# Patient Record
Sex: Male | Born: 1940
Health system: Southern US, Community
[De-identification: ages and names within clinical notes are randomized; demographics above are authoritative.]

## PROBLEM LIST (undated history)

## (undated) DIAGNOSIS — K635 Polyp of colon: Secondary | ICD-10-CM

## (undated) DIAGNOSIS — N189 Chronic kidney disease, unspecified: Secondary | ICD-10-CM

## (undated) DIAGNOSIS — E785 Hyperlipidemia, unspecified: Secondary | ICD-10-CM

## (undated) DIAGNOSIS — J45909 Unspecified asthma, uncomplicated: Secondary | ICD-10-CM

## (undated) DIAGNOSIS — K219 Gastro-esophageal reflux disease without esophagitis: Secondary | ICD-10-CM

## (undated) DIAGNOSIS — B019 Varicella without complication: Secondary | ICD-10-CM

## (undated) DIAGNOSIS — A539 Syphilis, unspecified: Secondary | ICD-10-CM

## (undated) DIAGNOSIS — M1990 Unspecified osteoarthritis, unspecified site: Secondary | ICD-10-CM

## (undated) DIAGNOSIS — C61 Malignant neoplasm of prostate: Secondary | ICD-10-CM

## (undated) DIAGNOSIS — R011 Cardiac murmur, unspecified: Secondary | ICD-10-CM

## (undated) DIAGNOSIS — I10 Essential (primary) hypertension: Secondary | ICD-10-CM

## (undated) DIAGNOSIS — T7840XA Allergy, unspecified, initial encounter: Secondary | ICD-10-CM

## (undated) DIAGNOSIS — E119 Type 2 diabetes mellitus without complications: Secondary | ICD-10-CM

## (undated) HISTORY — DX: Cardiac murmur, unspecified: R01.1

## (undated) HISTORY — DX: Syphilis, unspecified: A53.9

## (undated) HISTORY — PX: INGUINAL LYMPH NODE BIOPSY: SHX5865

## (undated) HISTORY — DX: Varicella without complication: B01.9

## (undated) HISTORY — DX: Unspecified asthma, uncomplicated: J45.909

## (undated) HISTORY — DX: Unspecified osteoarthritis, unspecified site: M19.90

## (undated) HISTORY — DX: Allergy, unspecified, initial encounter: T78.40XA

## (undated) HISTORY — DX: Hyperlipidemia, unspecified: E78.5

## (undated) HISTORY — DX: Gastro-esophageal reflux disease without esophagitis: K21.9

## (undated) HISTORY — DX: Essential (primary) hypertension: I10

## (undated) HISTORY — DX: Polyp of colon: K63.5

## (undated) HISTORY — DX: Chronic kidney disease, unspecified: N18.9

## (undated) HISTORY — PX: PROSTATE BIOPSY: SHX241

---

## 2014-07-09 DIAGNOSIS — Z79899 Other long term (current) drug therapy: Secondary | ICD-10-CM | POA: Diagnosis not present

## 2014-07-09 DIAGNOSIS — I10 Essential (primary) hypertension: Secondary | ICD-10-CM | POA: Diagnosis not present

## 2017-03-18 DIAGNOSIS — Z23 Encounter for immunization: Secondary | ICD-10-CM | POA: Diagnosis not present

## 2017-03-18 DIAGNOSIS — Z0000 Encounter for general adult medical examination without abnormal findings: Secondary | ICD-10-CM | POA: Diagnosis not present

## 2017-03-18 DIAGNOSIS — Z125 Encounter for screening for malignant neoplasm of prostate: Secondary | ICD-10-CM | POA: Diagnosis not present

## 2017-03-18 DIAGNOSIS — I129 Hypertensive chronic kidney disease with stage 1 through stage 4 chronic kidney disease, or unspecified chronic kidney disease: Secondary | ICD-10-CM | POA: Diagnosis not present

## 2017-03-18 DIAGNOSIS — Z1211 Encounter for screening for malignant neoplasm of colon: Secondary | ICD-10-CM | POA: Diagnosis not present

## 2017-03-18 DIAGNOSIS — E782 Mixed hyperlipidemia: Secondary | ICD-10-CM | POA: Diagnosis not present

## 2017-04-06 DIAGNOSIS — I10 Essential (primary) hypertension: Secondary | ICD-10-CM | POA: Diagnosis not present

## 2017-04-06 DIAGNOSIS — Z86010 Personal history of colonic polyps: Secondary | ICD-10-CM | POA: Diagnosis not present

## 2017-04-06 DIAGNOSIS — K219 Gastro-esophageal reflux disease without esophagitis: Secondary | ICD-10-CM | POA: Diagnosis not present

## 2017-04-14 DIAGNOSIS — K5730 Diverticulosis of large intestine without perforation or abscess without bleeding: Secondary | ICD-10-CM | POA: Diagnosis not present

## 2017-04-14 DIAGNOSIS — D122 Benign neoplasm of ascending colon: Secondary | ICD-10-CM | POA: Diagnosis not present

## 2017-04-14 DIAGNOSIS — K635 Polyp of colon: Secondary | ICD-10-CM | POA: Diagnosis not present

## 2017-04-14 DIAGNOSIS — D123 Benign neoplasm of transverse colon: Secondary | ICD-10-CM | POA: Diagnosis not present

## 2017-04-14 DIAGNOSIS — Z86010 Personal history of colonic polyps: Secondary | ICD-10-CM | POA: Diagnosis not present

## 2017-05-18 DIAGNOSIS — H40023 Open angle with borderline findings, high risk, bilateral: Secondary | ICD-10-CM | POA: Diagnosis not present

## 2017-08-26 DIAGNOSIS — H00014 Hordeolum externum left upper eyelid: Secondary | ICD-10-CM | POA: Diagnosis not present

## 2017-09-15 DIAGNOSIS — I129 Hypertensive chronic kidney disease with stage 1 through stage 4 chronic kidney disease, or unspecified chronic kidney disease: Secondary | ICD-10-CM | POA: Diagnosis not present

## 2017-09-15 DIAGNOSIS — N182 Chronic kidney disease, stage 2 (mild): Secondary | ICD-10-CM | POA: Diagnosis not present

## 2017-09-15 DIAGNOSIS — E782 Mixed hyperlipidemia: Secondary | ICD-10-CM | POA: Diagnosis not present

## 2017-09-15 DIAGNOSIS — R972 Elevated prostate specific antigen [PSA]: Secondary | ICD-10-CM | POA: Diagnosis not present

## 2017-09-15 LAB — PSA: PSA: 28.9

## 2017-09-15 LAB — HEMOGLOBIN A1C: Hemoglobin A1C: 5.8

## 2017-10-14 DIAGNOSIS — R972 Elevated prostate specific antigen [PSA]: Secondary | ICD-10-CM | POA: Diagnosis not present

## 2017-10-14 DIAGNOSIS — N402 Nodular prostate without lower urinary tract symptoms: Secondary | ICD-10-CM | POA: Diagnosis not present

## 2017-10-28 DIAGNOSIS — R972 Elevated prostate specific antigen [PSA]: Secondary | ICD-10-CM | POA: Diagnosis not present

## 2017-11-08 ENCOUNTER — Other Ambulatory Visit: Payer: Self-pay | Admitting: Urology

## 2017-11-08 DIAGNOSIS — C61 Malignant neoplasm of prostate: Secondary | ICD-10-CM

## 2017-12-05 ENCOUNTER — Encounter (HOSPITAL_COMMUNITY)
Admission: RE | Admit: 2017-12-05 | Discharge: 2017-12-05 | Disposition: A | Discharge status: Home / Self Care | Payer: Medicare Other | Source: Ambulatory Visit | Attending: Urology | Admitting: Urology

## 2017-12-05 DIAGNOSIS — C61 Malignant neoplasm of prostate: Secondary | ICD-10-CM | POA: Diagnosis not present

## 2017-12-05 MED ORDER — TECHNETIUM TC 99M MEDRONATE IV KIT
20.0000 | PACK | Freq: Once | INTRAVENOUS | Status: AC | PRN
  Administered 2017-12-05: 21 via INTRAVENOUS

## 2017-12-27 ENCOUNTER — Other Ambulatory Visit: Payer: Self-pay | Admitting: Urology

## 2017-12-27 DIAGNOSIS — C61 Malignant neoplasm of prostate: Secondary | ICD-10-CM

## 2018-01-04 ENCOUNTER — Other Ambulatory Visit: Payer: Self-pay | Admitting: Radiology

## 2018-01-05 ENCOUNTER — Other Ambulatory Visit: Payer: Self-pay | Admitting: Urology

## 2018-01-05 ENCOUNTER — Encounter (HOSPITAL_COMMUNITY): Payer: Self-pay

## 2018-01-05 ENCOUNTER — Ambulatory Visit (HOSPITAL_COMMUNITY)
Admission: RE | Admit: 2018-01-05 | Discharge: 2018-01-05 | Disposition: A | Discharge status: Home / Self Care | Payer: Medicare Other | Source: Ambulatory Visit | Attending: Radiology | Admitting: Radiology

## 2018-01-05 ENCOUNTER — Ambulatory Visit (HOSPITAL_COMMUNITY)
Admission: RE | Admit: 2018-01-05 | Discharge: 2018-01-05 | Disposition: A | Discharge status: Home / Self Care | Payer: Medicare Other | Source: Ambulatory Visit | Attending: Urology | Admitting: Urology

## 2018-01-05 ENCOUNTER — Other Ambulatory Visit (HOSPITAL_COMMUNITY): Payer: Self-pay | Admitting: Student-PharmD

## 2018-01-05 DIAGNOSIS — R590 Localized enlarged lymph nodes: Secondary | ICD-10-CM | POA: Diagnosis present

## 2018-01-05 DIAGNOSIS — C774 Secondary and unspecified malignant neoplasm of inguinal and lower limb lymph nodes: Secondary | ICD-10-CM | POA: Diagnosis not present

## 2018-01-05 DIAGNOSIS — R61 Generalized hyperhidrosis: Secondary | ICD-10-CM | POA: Diagnosis present

## 2018-01-05 DIAGNOSIS — C61 Malignant neoplasm of prostate: Secondary | ICD-10-CM

## 2018-01-05 LAB — BASIC METABOLIC PANEL
Anion gap: 9 (ref 5–15)
BUN: 13 mg/dL (ref 8–23)
CHLORIDE: 107 mmol/L (ref 98–111)
CO2: 26 mmol/L (ref 22–32)
CREATININE: 0.96 mg/dL (ref 0.61–1.24)
Calcium: 9.6 mg/dL (ref 8.9–10.3)
GFR calc non Af Amer: >60 mL/min (ref >60)
Glucose, Bld: 108 mg/dL — ABNORMAL HIGH (ref 70–99)
POTASSIUM: 4 mmol/L (ref 3.5–5.1)
Sodium: 142 mmol/L (ref 135–145)

## 2018-01-05 LAB — CBC WITH DIFFERENTIAL/PLATELET
Basophils Absolute: 0 10*3/uL (ref 0.0–0.1)
Basophils Relative: 1 %
EOS ABS: 0.1 10*3/uL (ref 0.0–0.7)
Eosinophils Relative: 3 %
HEMATOCRIT: 44.7 % (ref 39.0–52.0)
HEMOGLOBIN: 15.6 g/dL (ref 13.0–17.0)
LYMPHS ABS: 1.6 10*3/uL (ref 0.7–4.0)
LYMPHS PCT: 32 %
MCH: 33.5 pg (ref 26.0–34.0)
MCHC: 34.9 g/dL (ref 30.0–36.0)
MCV: 95.9 fL (ref 78.0–100.0)
MONOS PCT: 8 %
Monocytes Absolute: 0.4 10*3/uL (ref 0.1–1.0)
NEUTROS ABS: 3 10*3/uL (ref 1.7–7.7)
NEUTROS PCT: 58 %
Platelets: 212 10*3/uL (ref 150–400)
RBC: 4.66 MIL/uL (ref 4.22–5.81)
RDW: 13.5 % (ref 11.5–15.5)
WBC: 5.2 10*3/uL (ref 4.0–10.5)

## 2018-01-05 LAB — PROTIME-INR
INR: 0.91
Prothrombin Time: 12.2 seconds (ref 11.4–15.2)

## 2018-01-05 MED ORDER — SODIUM CHLORIDE 0.9 % IJ SOLN
INTRAMUSCULAR | Status: AC
  Filled 2018-01-05: qty 30

## 2018-01-05 MED ORDER — MIDAZOLAM HCL 2 MG/2ML IJ SOLN
INTRAMUSCULAR | Status: AC
  Filled 2018-01-05: qty 2

## 2018-01-05 MED ORDER — LIDOCAINE HCL (PF) 1 % IJ SOLN
INTRAMUSCULAR | Status: AC | PRN
  Administered 2018-01-05: 10 mL

## 2018-01-05 MED ORDER — FENTANYL CITRATE (PF) 100 MCG/2ML IJ SOLN
INTRAMUSCULAR | Status: AC
  Filled 2018-01-05: qty 2

## 2018-01-05 MED ORDER — MIDAZOLAM HCL 2 MG/2ML IJ SOLN
INTRAMUSCULAR | Status: AC | PRN
  Administered 2018-01-05: 1 mg via INTRAVENOUS

## 2018-01-05 MED ORDER — SODIUM CHLORIDE 0.9 % IV SOLN
INTRAVENOUS | Status: DC
  Administered 2018-01-05: 11:00:00 via INTRAVENOUS

## 2018-01-05 MED ORDER — LIDOCAINE HCL 1 % IJ SOLN
INTRAMUSCULAR | Status: AC
  Filled 2018-01-05: qty 20

## 2018-01-05 MED ORDER — FENTANYL CITRATE (PF) 100 MCG/2ML IJ SOLN
INTRAMUSCULAR | Status: AC | PRN
  Administered 2018-01-05: 50 ug via INTRAVENOUS

## 2018-01-05 NOTE — H&P (Signed)
Chief Complaint: Patient was seen in consultation today for image guided right inguinal lymph node biopsy  Referring Physician(s): [place]  Supervising Physician: Aletta Edouard  Patient Status: [facility] - Out-pt  History of Present Illness: Lawrence Wong is a 77 y.o. male with history of recently diagnosed prostate cancer and CT scan last month showing borderline enlarged pelvic/iliac lymph nodes.  He presents today for image guided right inguinal lymph node biopsy for further evaluation.  History reviewed. No pertinent past medical history.  History reviewed. No pertinent surgical history.  Allergies: Patient has no allergy information on record.  Medications: Prior to Admission medications   Not on File     History reviewed. No pertinent family history.  Social History   Socioeconomic History  . Marital status: Single    Spouse name: Not on file  . Number of children: Not on file  . Years of education: Not on file  . Highest education level: Not on file  Occupational History  . Not on file  Social Needs  . Financial resource strain: Not on file  . Food insecurity:    Worry: Not on file    Inability: Not on file  . Transportation needs:    Medical: Not on file    Non-medical: Not on file  Tobacco Use  . Smoking status: Former Research scientist (life sciences)  . Smokeless tobacco: Never Used  Substance and Sexual Activity  . Alcohol use: Not on file  . Drug use: Never  . Sexual activity: Not on file  Lifestyle  . Physical activity:    Days per week: Not on file    Minutes per session: Not on file  . Stress: Not on file  Relationships  . Social connections:    Talks on phone: Not on file    Gets together: Not on file    Attends religious service: Not on file    Active member of club or organization: Not on file    Attends meetings of clubs or organizations: Not on file    Relationship status: Not on file  Other Topics Concern  . Not on file  Social History  Narrative  . Not on file      Review of Systems denies fever, headache, chest pain, dyspnea, cough, abdominal/back pain, nausea, vomiting or bleeding.  Vital Signs: BP (!) 148/97 (BP Location: Right Arm)   Pulse (!) 57   Temp 97.6 F (36.4 C) (Oral)   Resp 16   SpO2 98%   Physical Exam awake, alert.  Chest few fine bibasilar crackles.  Heart with regular rate and rhythm.  Abdomen soft, positive bowel sounds, nontender.  No lower extremity edema.  Imaging: No results found.  Labs:  CBC: Recent Labs    01/05/18 1116  WBC 5.2  HGB 15.6  HCT 44.7  PLT 212    COAGS: Recent Labs    01/05/18 1116  INR 0.91    BMP: Recent Labs    01/05/18 1116  NA 142  K 4.0  CL 107  CO2 26  GLUCOSE 108*  BUN 13  CALCIUM 9.6  CREATININE 0.96  GFRNONAA >60  GFRAA >60    LIVER FUNCTION TESTS: No results for input(s): BILITOT, AST, ALT, ALKPHOS, PROT, ALBUMIN in the last 8760 hours.  TUMOR MARKERS: No results for input(s): AFPTM, CEA, CA199, CHROMGRNA in the last 8760 hours.  Assessment and Plan:  77 y.o. male with history of recently diagnosed prostate cancer and CT scan last month showing borderline enlarged pelvic  lymph nodes.  He presents today for image guided right inguinal lymph node biopsy for further evaluation.Risks and benefits discussed with the patient including, but not limited to bleeding, infection, damage to adjacent structures or low yield requiring additional tests.  All of the patient's questions were answered, patient is agreeable to proceed. Consent signed and in chart.     Thank you for this interesting consult.  I greatly enjoyed meeting Lawrence Wong and look forward to participating in their care.  A copy of this report was sent to the requesting provider on this date.  Electronically Signed: D. Rowe Robert, PA-C 01/05/2018, 12:09 PM   I spent a total of 20 minutes    in face to face in clinical consultation, greater than 50% of which was  counseling/coordinating care for image guided right inguinal lymph node biopsy

## 2018-01-05 NOTE — Discharge Instructions (Signed)
Moderate Conscious Sedation, Adult, Care After °These instructions provide you with information about caring for yourself after your procedure. Your health care provider may also give you more specific instructions. Your treatment has been planned according to current medical practices, but problems sometimes occur. Call your health care provider if you have any problems or questions after your procedure. °What can I expect after the procedure? °After your procedure, it is common: °· To feel sleepy for several hours. °· To feel clumsy and have poor balance for several hours. °· To have poor judgment for several hours. °· To vomit if you eat too soon. ° °Follow these instructions at home: °For at least 24 hours after the procedure: ° °· Do not: °? Participate in activities where you could fall or become injured. °? Drive. °? Use heavy machinery. °? Drink alcohol. °? Take sleeping pills or medicines that cause drowsiness. °? Make important decisions or sign legal documents. °? Take care of children on your own. °· Rest. °Eating and drinking °· Follow the diet recommended by your health care provider. °· If you vomit: °? Drink water, juice, or soup when you can drink without vomiting. °? Make sure you have little or no nausea before eating solid foods. °General instructions °· Have a responsible adult stay with you until you are awake and alert. °· Take over-the-counter and prescription medicines only as told by your health care provider. °· If you smoke, do not smoke without supervision. °· Keep all follow-up visits as told by your health care provider. This is important. °Contact a health care provider if: °· You keep feeling nauseous or you keep vomiting. °· You feel light-headed. °· You develop a rash. °· You have a fever. °Get help right away if: °· You have trouble breathing. °This information is not intended to replace advice given to you by your health care provider. Make sure you discuss any questions you have  with your health care provider. °Document Released: 03/07/2013 Document Revised: 10/20/2015 Document Reviewed: 09/06/2015 °Elsevier Interactive Patient Education © 2018 Elsevier Inc. ° ° °Needle Biopsy, Care After °These instructions give you information about caring for yourself after your procedure. Your doctor may also give you more specific instructions. Call your doctor if you have any problems or questions after your procedure. °Follow these instructions at home: °· Rest as told by your doctor. °· Take medicines only as told by your doctor. °· There are many different ways to close and cover the biopsy site, including stitches (sutures), skin glue, and adhesive strips. Follow instructions from your doctor about: °? How to take care of your biopsy site. °? When and how you should change your bandage (dressing).  You may remove your dressing tomorrow.  You may shower tomorrow. °? When you should remove your dressing. °? Removing whatever was used to close your biopsy site. °· Check your biopsy site every day for signs of infection. Watch for: °? Redness, swelling, or pain. °? Fluid, blood, or pus. °Contact a doctor if: °· You have a fever. °· You have redness, swelling, or pain at the biopsy site, and it lasts longer than a few days. °· You have fluid, blood, or pus coming from the biopsy site. °· You feel sick to your stomach (nauseous). °· You throw up (vomit). °Get help right away if: °· You are short of breath. °· You have trouble breathing. °· Your chest hurts. °· You feel dizzy or you pass out (faint). °· You have bleeding that does   not stop with pressure or a bandage. °· You cough up blood. °· Your belly (abdomen) hurts. °This information is not intended to replace advice given to you by your health care provider. Make sure you discuss any questions you have with your health care provider. °Document Released: 04/29/2008 Document Revised: 10/23/2015 Document Reviewed: 05/13/2014 °Elsevier Interactive  Patient Education © 2018 Elsevier Inc. ° °

## 2018-01-05 NOTE — Sedation Documentation (Signed)
No more sedation to be given waiting for repeat CT

## 2018-01-05 NOTE — Sedation Documentation (Signed)
MD not able to see lymph node waiting for CT to rescan.

## 2018-01-05 NOTE — Procedures (Signed)
Interventional Radiology Procedure Note  Procedure: CT guided biopsy of right inguinal lymph node  Complications: None  Estimated Blood Loss: None  Findings: 10 x 14 mm right high inguinal lymph node sampled with 18 G core device x 3 via 17 G needle.   Venetia Night. Kathlene Cote, M.D Pager:  [phone]

## 2018-01-12 ENCOUNTER — Encounter: Payer: Self-pay | Admitting: Radiation Oncology

## 2018-01-24 ENCOUNTER — Telehealth: Payer: Self-pay | Admitting: Radiation Oncology

## 2018-01-24 NOTE — Telephone Encounter (Signed)
01/24/2018 @ 10:55 am voice message received from the upstairs operator that patient want to cancel appt for 01/25/18 with Dr. Tammi Klippel. I have called and left a message to see if I can get the patient to [facility].

## 2018-01-25 ENCOUNTER — Ambulatory Visit: Payer: Medicare Other | Admitting: Radiation Oncology

## 2018-02-08 ENCOUNTER — Encounter: Payer: Self-pay | Admitting: Radiation Oncology

## 2018-02-08 NOTE — Progress Notes (Signed)
GU Location of Tumor / Histology: prostatic adenocarcinoma  If Prostate Cancer, Gleason Score is (4 + 3) and PSA is (32.7). Prostate volume: 57 ml  Lawrence Wong had borderline enlarged Left and Right external iliac lymph nodes. Bone scan/CT A/P on 12/05/2017 with no definite evidence of osseous metastatic disease but 1 irregular enhancing contour in the posterior left aspect of the prostate gland concerning for extracapsular extension. Biopsy of right inguinal lymph node negative for disease.     Past/Anticipated interventions by urology, if any: prostate biopsy, referral for inguinal biopsy, CT abdomen/pelvis, bone scan, referral to radiation oncology  Past/Anticipated interventions by medical oncology, if any: no  Weight changes, if any: no  Bowel/Bladder complaints, if any: IPSS 8. SHIM 12. Denies dysuria, hematuria, urinary leakage or incontinence.    Nausea/Vomiting, if any: no  Pain issues, if any:  Reports back pain x 30-40 years. Denies any new pain.   SAFETY ISSUES:  Prior radiation? no  Pacemaker/ICD? no  Possible current pregnancy? no  Is the patient on methotrexate? no  Current Complaints / other details:  77 year old male. NKDA. Single. 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

## 2018-02-09 ENCOUNTER — Ambulatory Visit
Admission: RE | Admit: 2018-02-09 | Discharge: 2018-02-09 | Disposition: A | Discharge status: Home / Self Care | Payer: Medicare Other | Source: Ambulatory Visit | Attending: Radiation Oncology | Admitting: Radiation Oncology

## 2018-02-09 ENCOUNTER — Other Ambulatory Visit: Payer: Self-pay

## 2018-02-09 ENCOUNTER — Encounter: Payer: Self-pay | Admitting: Medical Oncology

## 2018-02-09 ENCOUNTER — Encounter: Payer: Self-pay | Admitting: Radiation Oncology

## 2018-02-09 DIAGNOSIS — Z87891 Personal history of nicotine dependence: Secondary | ICD-10-CM | POA: Diagnosis not present

## 2018-02-09 DIAGNOSIS — C61 Malignant neoplasm of prostate: Secondary | ICD-10-CM | POA: Diagnosis not present

## 2018-02-09 HISTORY — DX: Malignant neoplasm of prostate: C61

## 2018-02-09 NOTE — Progress Notes (Signed)
Introduced myself to Lawrence Wong as the prostate nurse navigator and my role. He interested in LT- ADT and radiation. He is aware that Dr. Alan Ripper office will call him with an appointment for ADT then we will schedule CT simulation  8 weeks later. I will continue to follow and asked him to call with questions or concerns.

## 2018-02-09 NOTE — Progress Notes (Signed)
Radiation Oncology         [phone] ________________________________  Initial Outpatient Consultation  Name: Lawrence Wong MRN: [id number]  Date: 02/09/2018  DOB: [birth year]  RX:[id number], No Pcp Per  Franchot Gallo, MD   REFERRING PHYSICIAN: Franchot Gallo, MD  DIAGNOSIS: 77 y.o. gentleman with Stage T2a adenocarcinoma of the prostate with Gleason Score of 4+3, and PSA of 32.7    ICD-10-CM   1. Malignant neoplasm of prostate ([place]) C61     HISTORY OF PRESENT ILLNESS: Lawrence Wong is a 77 y.o. male with a diagnosis of prostate cancer. He was noted to have an elevated PSA of 28.9 in April 2019 by his primary care physician, Dr. Glendale Chard.  Per patient, he had had normal PSAs up to that point and had been seen at [facility] several years prior for a prostate exam which was normal.  Due to recent elevated PSA, he was referred back for evaluation in urology by Dr. Diona Fanti on 10/14/2017, where a digital rectal examination was performed revealing a 6 mm prostate nodule in the left base.  Repeat PSA at that time was increased to 32.70.  Therefore, the patient proceeded to transrectal ultrasound with 12 biopsies of the prostate on 10/28/2017.  The prostate volume measured 57 cc.  Out of 12 core biopsies, 4 were positive.  The maximum Gleason score was 4+3, and this was seen in the left mid lateral.  There was Gleason 3+4 seen in the left base lateral and left mid, and Gleason 3+3 seen in the left apex lateral.  Biopsies of prostate reveal:   Staging scans on 12/05/2017 include CT of the abdomen/pelvis which showed irregular enhancing contour in the posterior left aspect of the prostate gland concerning for prostate carcinoma extracapsular extension. There were also indeterminate borderline enlarged left and right external iliac lymph nodes. No periaortic lymphadenopathy. A bone scan on 12/05/17 showed scattered degenerative type uptake at the shoulders, hips, knees, elbows, and  wrists but no definite evidence of osseous metastatic disease.  The patient underwent a CT guided core needle biopsy of a high right inguinal lymph node on 01/05/2018 which revealed lymph nodal tissue with no tumor identified.  The patient reviewed these results with his urologist and he has kindly been referred today for discussion of potential radiation treatment options.  PREVIOUS RADIATION THERAPY: No  PAST MEDICAL HISTORY:  Past Medical History:  Diagnosis Date  . Prostate cancer ([place])       PAST SURGICAL HISTORY: Past Surgical History:  Procedure Laterality Date  . INGUINAL LYMPH NODE BIOPSY    . PROSTATE BIOPSY      FAMILY HISTORY:  Family History  Problem Relation Age of Onset  . Cancer Neg Hx     SOCIAL HISTORY:  Social History   Socioeconomic History  . Marital status: Single    Spouse name: Not on file  . Number of children: 1  . Years of education: Not on file  . Highest education level: Not on file  Occupational History  . Occupation: retired  Scientific laboratory technician  . Financial resource strain: Not on file  . Food insecurity:    Worry: Not on file    Inability: Not on file  . Transportation needs:    Medical: Not on file    Non-medical: Not on file  Tobacco Use  . Smoking status: Former Smoker    Packs/day: 1.00    Years: 40.00    Pack years: 40.00    Types: Cigarettes  Last attempt to quit: 05/31/1998    Years since quitting: 19.7  . Smokeless tobacco: Never Used  . Tobacco comment: States, "I have been around cigarettes my entire lift. 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 Alcohol use: Not Currently  . Drug use: Never  . Sexual activity: Yes  Lifestyle  . Physical activity:    Days per week: Not on file    Minutes per session: Not on file  . Stress: Not on file  Relationships  . Social connections:    Talks on phone: Not on file    Gets together: Not on file    Attends religious service: Not on  file    Active member of club or organization: Not on file    Attends meetings of clubs or organizations: Not on file    Relationship status: Not on file  . Intimate partner violence:    Fear of current or ex partner: Not on file    Emotionally abused: Not on file    Physically abused: Not on file    Forced sexual activity: Not on file  Other Topics Concern  . Not on file  Social History Narrative   Single. Has one son who resides in [place]. 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 Resides primarily in [place], but commutes back and forth between [place] and his property in [place].    ALLERGIES: Patient has no known allergies.  MEDICATIONS:  No current outpatient medications on file.   No current facility-administered medications for this encounter.     REVIEW OF SYSTEMS:  On review of systems, the patient reports that he is doing well overall. He denies any chest pain, shortness of breath, cough, fevers, chills, night sweats, or unintended weight changes. He denies any bowel disturbances, and denies abdominal pain, nausea or vomiting. He reports chronic back pain x30-40 years, but denies any new musculoskeletal or joint aches or pains. His IPSS was 8, indicating moderate urinary symptoms. He denies dysuria, hematuria, urinary leakage or incontinence. His SHIM score is 12, indicating he is able to complete sexual activity with some attempts. A complete review of systems is obtained and is otherwise negative.    PHYSICAL EXAM:  Wt Readings from Last 3 Encounters:  02/09/18 164 lb 3.2 oz (74.5 kg)   Temp Readings from Last 3 Encounters:  02/09/18 98.2 F (36.8 C) (Oral)  01/05/18 (!) 97.5 F (36.4 C) (Oral)  01/05/18 97.9 F (36.6 C) (Oral)   BP Readings from Last 3 Encounters:  02/09/18 (!) 144/86  01/05/18 122/79  01/05/18 (!) 142/96   Pulse Readings from Last 3 Encounters:  02/09/18 68  01/05/18 (!) 55   01/05/18 (!) 48   Pain Assessment Pain Score: 0-No pain/10  In general this is a well appearing African-American male in no acute distress. He is alert and oriented x4 and appropriate throughout the examination. HEENT reveals that the patient is normocephalic, atraumatic. EOMs are intact. PERRLA. Skin is intact without any evidence of gross lesions. Cardiovascular exam reveals a regular rate and rhythm, no clicks rubs or murmurs are auscultated. Chest is clear to auscultation bilaterally. Lymphatic assessment is performed and does not reveal any adenopathy in the cervical, supraclavicular, axillary, or inguinal chains. Abdomen has active bowel sounds in all quadrants and is intact. The abdomen is soft, non tender, non distended. Lower extremities are negative for pretibial pitting  edema, deep calf tenderness, cyanosis or clubbing.   KPS = 100  100 - Normal; no complaints; no evidence of disease. 90   - Able to carry on normal activity; minor signs or symptoms of disease. 80   - Normal activity with effort; some signs or symptoms of disease. 9   - Cares for self; unable to carry on normal activity or to do active work. 60   - Requires occasional assistance, but is able to care for most of his personal needs. 50   - Requires considerable assistance and frequent medical care. 46   - Disabled; requires special care and assistance. 31   - Severely disabled; hospital admission is indicated although death not imminent. 51   - Very sick; hospital admission necessary; active supportive treatment necessary. 10   - Moribund; fatal processes progressing rapidly. 0     - Dead  Karnofsky DA, Abelmann Gambier, Craver LS and Burchenal [facility] [phone]) The use of the nitrogen mustards in the palliative treatment of carcinoma: with particular reference to bronchogenic carcinoma Cancer 1 634-56  LABORATORY DATA:  Lab Results  Component Value Date   WBC 5.2 01/05/2018   HGB 15.6 01/05/2018   HCT 44.7 01/05/2018    MCV 95.9 01/05/2018   PLT 212 01/05/2018   Lab Results  Component Value Date   NA 142 01/05/2018   K 4.0 01/05/2018   CL 107 01/05/2018   CO2 26 01/05/2018   No results found for: ALT, AST, GGT, ALKPHOS, BILITOT   RADIOGRAPHY: No results found.    IMPRESSION/PLAN: 1. 77 y.o. gentleman with Stage cT2a adenocarcinoma of the prostate with Gleason Score of 4+3, and PSA of 32.7. We discussed the patient's workup and outlined the nature of prostate cancer in this setting. The patient's T stage, Gleason's score, and PSA put him into the high risk group. Accordingly, he is eligible for 8 weeks of external radiation in combination with androgen deprivation therapy. We discussed the available radiation techniques, and focused on the details and logistics and delivery. We discussed and outlined the risks, benefits, short and long-term effects associated with radiotherapy and compared and contrasted these with prostatectomy. We discussed the role of SpaceOAR in reducing the rectal toxicity associated with radiotherapy. We also detailed the role of ADT in the treatment of high-risk prostate cancer and outlined the associated side effects that could be expected with this therapy.  At the end of the conversation the patient is interested in moving forward with 8 weeks of external radiotherapy in combination with LT-ADT. He has not received his first Lupron injection, and has not had placement of gold fiducial markers. We will share our discussion with Dr. Diona Fanti and move forward with treatment planning accordingly.  We will contact [facility] to make arrangements to start ADT in the near future and would anticipate beginning radiotherapy approximately 8 weeks thereafter.  He will need an appointment for fiducial markers and SpaceOAR gel placement in early November, prior to [place], in preparation for proceeding with IMRT around mid-November 2019.    Nicholos Johns, PA-C    Tyler Pita, MD  [facility] Direct Dial: [phone]  Fax: [phone] [email or web address]  Skype  [email or web address]  This document serves as a record of services personally performed by Tyler Pita, MD and Freeman Caldron, PA-C. It was created on their behalf by Rae Lips, a trained medical scribe. The creation of this record is based on the scribe's personal observations and the providers'  statements to them. This document has been checked and approved by the attending providers.

## 2018-02-09 NOTE — Progress Notes (Signed)
See progress note under physician encounter. 

## 2018-02-27 ENCOUNTER — Telehealth: Payer: Self-pay | Admitting: Nurse Practitioner

## 2018-02-27 NOTE — Telephone Encounter (Signed)
I called the patient to schedule his AWV w/ [place].  He said that he received a letter from [facility] that we were no longer in network.  I explained that it was sent in error.  He requested to cancel the 03/24/18 appt because he is supposed to be starting radiation treatments around the end of October.  I tried to reschedule it, but he said he will call back when he's ready to see PCP. VDM (DD)

## 2018-03-24 ENCOUNTER — Ambulatory Visit: Payer: Self-pay | Admitting: Nurse Practitioner

## 2018-04-06 ENCOUNTER — Telehealth: Payer: Self-pay | Admitting: *Deleted

## 2018-04-06 NOTE — Telephone Encounter (Signed)
CALLED PATIENT TO INFORM OF GOLD SEED PLACEMENT AND SPACE OAR TO BE DONE @ [facility] ON NOV. 21 AND HIS SIM ON 04-26-18 @ DR. MANNING'S OFFICE, LVM  FOR A RETURN CALL

## 2018-04-20 ENCOUNTER — Telehealth: Payer: Self-pay | Admitting: *Deleted

## 2018-04-20 NOTE — Telephone Encounter (Signed)
RETURNED PATIENT'S PHONE CALL, LVM FOR A RETURN CALL 

## 2018-04-25 ENCOUNTER — Other Ambulatory Visit: Payer: Self-pay | Admitting: Urology

## 2018-04-25 DIAGNOSIS — C61 Malignant neoplasm of prostate: Secondary | ICD-10-CM

## 2018-04-25 NOTE — Progress Notes (Signed)
  Radiation Oncology         [phone] ________________________________  Name: Lawrence Wong MRN: [id number]  Date: 04/26/2018  DOB: 1940/10/03  SIMULATION AND TREATMENT PLANNING NOTE    ICD-10-CM   1. Malignant neoplasm of prostate ([place]) C61     DIAGNOSIS:  77 y.o. gentleman with Stage T2a adenocarcinoma of the prostate with Gleason Score of 4+3, and PSA of 32.7  NARRATIVE:  The patient was brought to the [facility].  Identity was confirmed.  All relevant records and images related to the planned course of therapy were reviewed.  The patient freely provided informed written consent to proceed with treatment after reviewing the details related to the planned course of therapy. The consent form was witnessed and verified by the simulation staff.  Then, the patient was set-up in a stable reproducible supine position for radiation therapy.  A vacuum lock pillow device was custom fabricated to position his legs in a reproducible immobilized position.  Then, I performed a urethrogram under sterile conditions to identify the prostatic apex.  CT images were obtained.  Surface markings were placed.  The CT images were loaded into the planning software.  Then the prostate target and avoidance structures including the rectum, bladder, bowel and hips were contoured.  Treatment planning then occurred.  The radiation prescription was entered and confirmed.  A total of one complex treatment devices were fabricated. I have requested : Intensity Modulated Radiotherapy (IMRT) is medically necessary for this case for the following reason:  Rectal sparing.Marland Kitchen  PLAN:  The patient will receive 45 Gy in 25 fractions of 1.8 Gy, followed by a boost to the prostate to a total dose of 75 Gy with 15 additional fractions of 2.0 Gy.  ________________________________  Sheral Apley Tammi Klippel, M.D.

## 2018-04-26 ENCOUNTER — Ambulatory Visit
Admission: RE | Admit: 2018-04-26 | Discharge: 2018-04-26 | Disposition: A | Discharge status: Home / Self Care | Payer: Medicare Other | Source: Ambulatory Visit | Attending: Radiation Oncology | Admitting: Radiation Oncology

## 2018-04-26 ENCOUNTER — Encounter: Payer: Self-pay | Admitting: Urology

## 2018-04-26 DIAGNOSIS — C61 Malignant neoplasm of prostate: Secondary | ICD-10-CM

## 2018-04-26 NOTE — Progress Notes (Signed)
I have confirmed that the patient started ADT with Dr. Diona Fanti on 02/23/2018 with [facility] Koller and received a 59-month Lupron injection on 03/29/2018.   Nicholos Johns, MMS, PA-C [facility]: [phone]  Fax: [phone]

## 2018-05-01 ENCOUNTER — Ambulatory Visit (HOSPITAL_COMMUNITY)
Admission: RE | Admit: 2018-05-01 | Discharge: 2018-05-01 | Disposition: A | Discharge status: Home / Self Care | Payer: Medicare Other | Source: Ambulatory Visit | Attending: Urology | Admitting: Urology

## 2018-05-01 DIAGNOSIS — C61 Malignant neoplasm of prostate: Secondary | ICD-10-CM | POA: Insufficient documentation

## 2018-05-08 DIAGNOSIS — C61 Malignant neoplasm of prostate: Secondary | ICD-10-CM | POA: Insufficient documentation

## 2018-05-09 ENCOUNTER — Ambulatory Visit
Admission: RE | Admit: 2018-05-09 | Discharge: 2018-05-09 | Disposition: A | Discharge status: Home / Self Care | Payer: Medicare Other | Source: Ambulatory Visit | Attending: Radiation Oncology | Admitting: Radiation Oncology

## 2018-05-09 DIAGNOSIS — C61 Malignant neoplasm of prostate: Secondary | ICD-10-CM | POA: Diagnosis not present

## 2018-05-10 ENCOUNTER — Ambulatory Visit
Admission: RE | Admit: 2018-05-10 | Discharge: 2018-05-10 | Disposition: A | Discharge status: Home / Self Care | Payer: Medicare Other | Source: Ambulatory Visit | Attending: Radiation Oncology | Admitting: Radiation Oncology

## 2018-05-10 DIAGNOSIS — C61 Malignant neoplasm of prostate: Secondary | ICD-10-CM | POA: Diagnosis not present

## 2018-05-11 ENCOUNTER — Ambulatory Visit
Admission: RE | Admit: 2018-05-11 | Discharge: 2018-05-11 | Disposition: A | Discharge status: Home / Self Care | Payer: Medicare Other | Source: Ambulatory Visit | Attending: Radiation Oncology | Admitting: Radiation Oncology

## 2018-05-11 DIAGNOSIS — C61 Malignant neoplasm of prostate: Secondary | ICD-10-CM | POA: Diagnosis not present

## 2018-05-12 ENCOUNTER — Ambulatory Visit
Admission: RE | Admit: 2018-05-12 | Discharge: 2018-05-12 | Disposition: A | Discharge status: Home / Self Care | Payer: Medicare Other | Source: Ambulatory Visit | Attending: Radiation Oncology | Admitting: Radiation Oncology

## 2018-05-12 DIAGNOSIS — C61 Malignant neoplasm of prostate: Secondary | ICD-10-CM | POA: Diagnosis not present

## 2018-05-15 ENCOUNTER — Ambulatory Visit
Admission: RE | Admit: 2018-05-15 | Discharge: 2018-05-15 | Disposition: A | Discharge status: Home / Self Care | Payer: Medicare Other | Source: Ambulatory Visit | Attending: Radiation Oncology | Admitting: Radiation Oncology

## 2018-05-15 DIAGNOSIS — C61 Malignant neoplasm of prostate: Secondary | ICD-10-CM | POA: Diagnosis not present

## 2018-05-16 ENCOUNTER — Ambulatory Visit
Admission: RE | Admit: 2018-05-16 | Discharge: 2018-05-16 | Disposition: A | Discharge status: Home / Self Care | Payer: Medicare Other | Source: Ambulatory Visit | Attending: Radiation Oncology | Admitting: Radiation Oncology

## 2018-05-16 DIAGNOSIS — C61 Malignant neoplasm of prostate: Secondary | ICD-10-CM | POA: Diagnosis not present

## 2018-05-17 ENCOUNTER — Ambulatory Visit
Admission: RE | Admit: 2018-05-17 | Discharge: 2018-05-17 | Disposition: A | Discharge status: Home / Self Care | Payer: Medicare Other | Source: Ambulatory Visit | Attending: Radiation Oncology | Admitting: Radiation Oncology

## 2018-05-17 DIAGNOSIS — C61 Malignant neoplasm of prostate: Secondary | ICD-10-CM | POA: Diagnosis not present

## 2018-05-18 ENCOUNTER — Ambulatory Visit
Admission: RE | Admit: 2018-05-18 | Discharge: 2018-05-18 | Disposition: A | Discharge status: Home / Self Care | Payer: Medicare Other | Source: Ambulatory Visit | Attending: Radiation Oncology | Admitting: Radiation Oncology

## 2018-05-18 DIAGNOSIS — C61 Malignant neoplasm of prostate: Secondary | ICD-10-CM | POA: Diagnosis not present

## 2018-05-18 NOTE — Progress Notes (Signed)
Pt here for patient teaching.  Pt given Radiation and You booklet.  Reviewed areas of pertinence such as diarrhea, fatigue and urinary and bladder changes . Pt able to give teach back of have Imodium on hand and drink plenty of water,. Pt verbalized understanding, and  of information given and will contact nursing with any questions or concerns.     [email or web address]

## 2018-05-19 ENCOUNTER — Ambulatory Visit
Admission: RE | Admit: 2018-05-19 | Discharge: 2018-05-19 | Disposition: A | Discharge status: Home / Self Care | Payer: Medicare Other | Source: Ambulatory Visit | Attending: Radiation Oncology | Admitting: Radiation Oncology

## 2018-05-19 DIAGNOSIS — C61 Malignant neoplasm of prostate: Secondary | ICD-10-CM | POA: Diagnosis not present

## 2018-05-22 ENCOUNTER — Ambulatory Visit
Admission: RE | Admit: 2018-05-22 | Discharge: 2018-05-22 | Disposition: A | Discharge status: Home / Self Care | Payer: Medicare Other | Source: Ambulatory Visit | Attending: Radiation Oncology | Admitting: Radiation Oncology

## 2018-05-22 DIAGNOSIS — C61 Malignant neoplasm of prostate: Secondary | ICD-10-CM | POA: Diagnosis not present

## 2018-05-23 ENCOUNTER — Ambulatory Visit
Admission: RE | Admit: 2018-05-23 | Discharge: 2018-05-23 | Disposition: A | Discharge status: Home / Self Care | Payer: Medicare Other | Source: Ambulatory Visit | Attending: Radiation Oncology | Admitting: Radiation Oncology

## 2018-05-23 DIAGNOSIS — C61 Malignant neoplasm of prostate: Secondary | ICD-10-CM | POA: Diagnosis not present

## 2018-05-25 ENCOUNTER — Ambulatory Visit
Admission: RE | Admit: 2018-05-25 | Discharge: 2018-05-25 | Disposition: A | Discharge status: Home / Self Care | Payer: Medicare Other | Source: Ambulatory Visit | Attending: Radiation Oncology | Admitting: Radiation Oncology

## 2018-05-25 DIAGNOSIS — C61 Malignant neoplasm of prostate: Secondary | ICD-10-CM | POA: Diagnosis not present

## 2018-05-26 ENCOUNTER — Ambulatory Visit
Admission: RE | Admit: 2018-05-26 | Discharge: 2018-05-26 | Disposition: A | Discharge status: Home / Self Care | Payer: Medicare Other | Source: Ambulatory Visit | Attending: Radiation Oncology | Admitting: Radiation Oncology

## 2018-05-26 DIAGNOSIS — C61 Malignant neoplasm of prostate: Secondary | ICD-10-CM | POA: Diagnosis not present

## 2018-05-29 ENCOUNTER — Ambulatory Visit
Admission: RE | Admit: 2018-05-29 | Discharge: 2018-05-29 | Disposition: A | Discharge status: Home / Self Care | Payer: Medicare Other | Source: Ambulatory Visit | Attending: Radiation Oncology | Admitting: Radiation Oncology

## 2018-05-29 DIAGNOSIS — C61 Malignant neoplasm of prostate: Secondary | ICD-10-CM | POA: Diagnosis not present

## 2018-05-30 ENCOUNTER — Ambulatory Visit
Admission: RE | Admit: 2018-05-30 | Discharge: 2018-05-30 | Disposition: A | Discharge status: Home / Self Care | Payer: Medicare Other | Source: Ambulatory Visit | Attending: Radiation Oncology | Admitting: Radiation Oncology

## 2018-05-30 DIAGNOSIS — C61 Malignant neoplasm of prostate: Secondary | ICD-10-CM | POA: Diagnosis not present

## 2018-06-01 ENCOUNTER — Ambulatory Visit
Admission: RE | Admit: 2018-06-01 | Discharge: 2018-06-01 | Disposition: A | Discharge status: Home / Self Care | Payer: Medicare HMO | Source: Ambulatory Visit | Attending: Radiation Oncology | Admitting: Radiation Oncology

## 2018-06-01 DIAGNOSIS — C61 Malignant neoplasm of prostate: Secondary | ICD-10-CM | POA: Diagnosis present

## 2018-06-02 ENCOUNTER — Ambulatory Visit
Admission: RE | Admit: 2018-06-02 | Discharge: 2018-06-02 | Disposition: A | Discharge status: Home / Self Care | Payer: Medicare HMO | Source: Ambulatory Visit | Attending: Radiation Oncology | Admitting: Radiation Oncology

## 2018-06-02 DIAGNOSIS — C61 Malignant neoplasm of prostate: Secondary | ICD-10-CM | POA: Diagnosis not present

## 2018-06-05 ENCOUNTER — Ambulatory Visit
Admission: RE | Admit: 2018-06-05 | Discharge: 2018-06-05 | Disposition: A | Discharge status: Home / Self Care | Payer: Medicare HMO | Source: Ambulatory Visit | Attending: Radiation Oncology | Admitting: Radiation Oncology

## 2018-06-05 DIAGNOSIS — C61 Malignant neoplasm of prostate: Secondary | ICD-10-CM | POA: Diagnosis not present

## 2018-06-06 ENCOUNTER — Ambulatory Visit
Admission: RE | Admit: 2018-06-06 | Discharge: 2018-06-06 | Disposition: A | Discharge status: Home / Self Care | Payer: Medicare HMO | Source: Ambulatory Visit | Attending: Radiation Oncology | Admitting: Radiation Oncology

## 2018-06-06 DIAGNOSIS — C61 Malignant neoplasm of prostate: Secondary | ICD-10-CM | POA: Diagnosis not present

## 2018-06-07 ENCOUNTER — Ambulatory Visit
Admission: RE | Admit: 2018-06-07 | Discharge: 2018-06-07 | Disposition: A | Discharge status: Home / Self Care | Payer: Medicare HMO | Source: Ambulatory Visit | Attending: Radiation Oncology | Admitting: Radiation Oncology

## 2018-06-07 DIAGNOSIS — C61 Malignant neoplasm of prostate: Secondary | ICD-10-CM | POA: Diagnosis not present

## 2018-06-08 ENCOUNTER — Ambulatory Visit
Admission: RE | Admit: 2018-06-08 | Discharge: 2018-06-08 | Disposition: A | Discharge status: Home / Self Care | Payer: Medicare HMO | Source: Ambulatory Visit | Attending: Radiation Oncology | Admitting: Radiation Oncology

## 2018-06-08 DIAGNOSIS — C61 Malignant neoplasm of prostate: Secondary | ICD-10-CM | POA: Diagnosis not present

## 2018-06-09 ENCOUNTER — Ambulatory Visit
Admission: RE | Admit: 2018-06-09 | Discharge: 2018-06-09 | Disposition: A | Discharge status: Home / Self Care | Payer: Medicare HMO | Source: Ambulatory Visit | Attending: Radiation Oncology | Admitting: Radiation Oncology

## 2018-06-09 DIAGNOSIS — C61 Malignant neoplasm of prostate: Secondary | ICD-10-CM | POA: Diagnosis not present

## 2018-06-12 ENCOUNTER — Ambulatory Visit
Admission: RE | Admit: 2018-06-12 | Discharge: 2018-06-12 | Disposition: A | Discharge status: Home / Self Care | Payer: Medicare HMO | Source: Ambulatory Visit | Attending: Radiation Oncology | Admitting: Radiation Oncology

## 2018-06-12 DIAGNOSIS — C61 Malignant neoplasm of prostate: Secondary | ICD-10-CM | POA: Diagnosis not present

## 2018-06-13 ENCOUNTER — Ambulatory Visit
Admission: RE | Admit: 2018-06-13 | Discharge: 2018-06-13 | Disposition: A | Discharge status: Home / Self Care | Payer: Medicare HMO | Source: Ambulatory Visit | Attending: Radiation Oncology | Admitting: Radiation Oncology

## 2018-06-13 DIAGNOSIS — C61 Malignant neoplasm of prostate: Secondary | ICD-10-CM | POA: Diagnosis not present

## 2018-06-14 ENCOUNTER — Ambulatory Visit
Admission: RE | Admit: 2018-06-14 | Discharge: 2018-06-14 | Disposition: A | Discharge status: Home / Self Care | Payer: Medicare HMO | Source: Ambulatory Visit | Attending: Radiation Oncology | Admitting: Radiation Oncology

## 2018-06-14 DIAGNOSIS — C61 Malignant neoplasm of prostate: Secondary | ICD-10-CM | POA: Diagnosis not present

## 2018-06-15 ENCOUNTER — Ambulatory Visit
Admission: RE | Admit: 2018-06-15 | Discharge: 2018-06-15 | Disposition: A | Discharge status: Home / Self Care | Payer: Medicare HMO | Source: Ambulatory Visit | Attending: Radiation Oncology | Admitting: Radiation Oncology

## 2018-06-15 DIAGNOSIS — C61 Malignant neoplasm of prostate: Secondary | ICD-10-CM | POA: Diagnosis not present

## 2018-06-16 ENCOUNTER — Ambulatory Visit
Admission: RE | Admit: 2018-06-16 | Discharge: 2018-06-16 | Disposition: A | Discharge status: Home / Self Care | Payer: Medicare HMO | Source: Ambulatory Visit | Attending: Radiation Oncology | Admitting: Radiation Oncology

## 2018-06-16 DIAGNOSIS — C61 Malignant neoplasm of prostate: Secondary | ICD-10-CM | POA: Diagnosis not present

## 2018-06-19 ENCOUNTER — Ambulatory Visit
Admission: RE | Admit: 2018-06-19 | Discharge: 2018-06-19 | Disposition: A | Discharge status: Home / Self Care | Payer: Medicare HMO | Source: Ambulatory Visit | Attending: Radiation Oncology | Admitting: Radiation Oncology

## 2018-06-19 DIAGNOSIS — C61 Malignant neoplasm of prostate: Secondary | ICD-10-CM | POA: Diagnosis not present

## 2018-06-20 ENCOUNTER — Ambulatory Visit
Admission: RE | Admit: 2018-06-20 | Discharge: 2018-06-20 | Disposition: A | Discharge status: Home / Self Care | Payer: Medicare HMO | Source: Ambulatory Visit | Attending: Radiation Oncology | Admitting: Radiation Oncology

## 2018-06-20 DIAGNOSIS — C61 Malignant neoplasm of prostate: Secondary | ICD-10-CM | POA: Diagnosis not present

## 2018-06-21 ENCOUNTER — Ambulatory Visit
Admission: RE | Admit: 2018-06-21 | Discharge: 2018-06-21 | Disposition: A | Discharge status: Home / Self Care | Payer: Medicare HMO | Source: Ambulatory Visit | Attending: Radiation Oncology | Admitting: Radiation Oncology

## 2018-06-21 DIAGNOSIS — C61 Malignant neoplasm of prostate: Secondary | ICD-10-CM | POA: Diagnosis not present

## 2018-06-22 ENCOUNTER — Ambulatory Visit
Admission: RE | Admit: 2018-06-22 | Discharge: 2018-06-22 | Disposition: A | Discharge status: Home / Self Care | Payer: Medicare HMO | Source: Ambulatory Visit | Attending: Radiation Oncology | Admitting: Radiation Oncology

## 2018-06-22 DIAGNOSIS — C61 Malignant neoplasm of prostate: Secondary | ICD-10-CM | POA: Diagnosis not present

## 2018-06-23 ENCOUNTER — Ambulatory Visit
Admission: RE | Admit: 2018-06-23 | Discharge: 2018-06-23 | Disposition: A | Discharge status: Home / Self Care | Payer: Medicare HMO | Source: Ambulatory Visit | Attending: Radiation Oncology | Admitting: Radiation Oncology

## 2018-06-23 DIAGNOSIS — C61 Malignant neoplasm of prostate: Secondary | ICD-10-CM | POA: Diagnosis not present

## 2018-06-26 ENCOUNTER — Ambulatory Visit
Admission: RE | Admit: 2018-06-26 | Discharge: 2018-06-26 | Disposition: A | Discharge status: Home / Self Care | Payer: Medicare HMO | Source: Ambulatory Visit | Attending: Radiation Oncology | Admitting: Radiation Oncology

## 2018-06-26 DIAGNOSIS — C61 Malignant neoplasm of prostate: Secondary | ICD-10-CM | POA: Diagnosis not present

## 2018-06-27 ENCOUNTER — Ambulatory Visit
Admission: RE | Admit: 2018-06-27 | Discharge: 2018-06-27 | Disposition: A | Discharge status: Home / Self Care | Payer: Medicare HMO | Source: Ambulatory Visit | Attending: Radiation Oncology | Admitting: Radiation Oncology

## 2018-06-27 DIAGNOSIS — C61 Malignant neoplasm of prostate: Secondary | ICD-10-CM | POA: Diagnosis not present

## 2018-06-28 ENCOUNTER — Ambulatory Visit
Admission: RE | Admit: 2018-06-28 | Discharge: 2018-06-28 | Disposition: A | Discharge status: Home / Self Care | Payer: Medicare HMO | Source: Ambulatory Visit | Attending: Radiation Oncology | Admitting: Radiation Oncology

## 2018-06-28 DIAGNOSIS — C61 Malignant neoplasm of prostate: Secondary | ICD-10-CM | POA: Diagnosis not present

## 2018-06-29 ENCOUNTER — Ambulatory Visit
Admission: RE | Admit: 2018-06-29 | Discharge: 2018-06-29 | Disposition: A | Discharge status: Home / Self Care | Payer: Medicare HMO | Source: Ambulatory Visit | Attending: Radiation Oncology | Admitting: Radiation Oncology

## 2018-06-29 DIAGNOSIS — C61 Malignant neoplasm of prostate: Secondary | ICD-10-CM | POA: Diagnosis not present

## 2018-06-30 ENCOUNTER — Ambulatory Visit
Admission: RE | Admit: 2018-06-30 | Discharge: 2018-06-30 | Disposition: A | Discharge status: Home / Self Care | Payer: Medicare HMO | Source: Ambulatory Visit | Attending: Radiation Oncology | Admitting: Radiation Oncology

## 2018-06-30 DIAGNOSIS — C61 Malignant neoplasm of prostate: Secondary | ICD-10-CM | POA: Diagnosis not present

## 2018-07-03 ENCOUNTER — Ambulatory Visit
Admission: RE | Admit: 2018-07-03 | Discharge: 2018-07-03 | Disposition: A | Discharge status: Home / Self Care | Payer: Medicare HMO | Source: Ambulatory Visit | Attending: Radiation Oncology | Admitting: Radiation Oncology

## 2018-07-03 DIAGNOSIS — C61 Malignant neoplasm of prostate: Secondary | ICD-10-CM | POA: Diagnosis present

## 2018-07-04 ENCOUNTER — Ambulatory Visit
Admission: RE | Admit: 2018-07-04 | Discharge: 2018-07-04 | Disposition: A | Discharge status: Home / Self Care | Payer: Medicare HMO | Source: Ambulatory Visit | Attending: Radiation Oncology | Admitting: Radiation Oncology

## 2018-07-04 DIAGNOSIS — C61 Malignant neoplasm of prostate: Secondary | ICD-10-CM | POA: Diagnosis not present

## 2018-07-05 ENCOUNTER — Ambulatory Visit
Admission: RE | Admit: 2018-07-05 | Discharge: 2018-07-05 | Disposition: A | Discharge status: Home / Self Care | Payer: Medicare HMO | Source: Ambulatory Visit | Attending: Radiation Oncology | Admitting: Radiation Oncology

## 2018-07-05 DIAGNOSIS — C61 Malignant neoplasm of prostate: Secondary | ICD-10-CM | POA: Diagnosis not present

## 2018-07-07 ENCOUNTER — Encounter: Payer: Self-pay | Admitting: Radiation Oncology

## 2018-07-07 NOTE — Progress Notes (Signed)
  Radiation Oncology         [phone]) [phone] ________________________________  Name: Lawrence Wong MRN: [id number]  Date: 07/07/2018  DOB: [birth year]  End of Treatment Note  Diagnosis:   78 y.o. gentleman with Stage T2a adenocarcinoma of the prostate with Gleason Score of 4+3, and PSA of 32.7     Indication for treatment:  Curative, Definitive Radiotherapy       Radiation treatment dates:   05/09/2018 - 07/05/2018  Site/dose:  1. The prostate, seminal vesicles, and pelvic lymph nodes were initially treated to 45 Gy in 25 fractions of 1.8 Gy  2. The prostate only was boosted to 75 Gy with 15 additional fractions of 2.0 Gy   Beams/energy:  1. The prostate, seminal vesicles, and pelvic lymph nodes were initially treated using VMAT intensity modulated radiotherapy delivering 6 megavolt photons. Image guidance was performed with CB-CT studies prior to each fraction. He was immobilized with a body fix lower extremity mold.  2. the prostate only was boosted using VMAT intensity modulated radiotherapy delivering 6 megavolt photons. Image guidance was performed with CB-CT studies prior to each fraction. He was immobilized with a body fix lower extremity mold.  Narrative: The patient tolerated radiation treatment relatively well.  He reported nocturia x2 that peaked at x4, mild fatigue, soft stools, urgency, and emptying his bladder. He reported instances of dysuria, scant leakage, incomplete emptying in the morning, intermittent stream, and itching to the radiation site. He denied hematuria and diarrhea throughout treatment.  Plan: The patient has completed radiation treatment. He will return to radiation oncology clinic for routine followup in one month. I advised him to call or return sooner if he has any questions or concerns related to his recovery or treatment. ________________________________  Sheral Apley. Tammi Klippel, M.D.

## 2018-08-02 ENCOUNTER — Telehealth: Payer: Self-pay | Admitting: Radiation Oncology

## 2018-08-02 NOTE — Telephone Encounter (Signed)
Received message from after hours call line that patient has questions about need for upcoming appointment. Phoned patient to inquire. Patient states, "I saw Dr. Diona Fanti this morning and everything is fine I don't think I need to come tomorrow." Inquired if patient received his ADT. He confirms he did this morning and has a follow up with Dr. Diona Fanti again in four months. Patient denies any needs from a radiation standpoint. Cancelled appointment at patient's request. Instructed patient to phone this RN with future needs reference radiation. Patient verbalized understanding of all reviewed and expressed appreciation for the return call.

## 2018-08-03 ENCOUNTER — Ambulatory Visit: Payer: Medicare HMO | Admitting: Urology

## 2018-09-20 ENCOUNTER — Telehealth: Payer: Self-pay | Admitting: General Practice

## 2018-09-20 NOTE — Telephone Encounter (Signed)
I called the patient and left a message asking him to call me at [phone] to schedule an appointment if he's still being seen here at [facility]. VDM (DD)

## 2018-09-21 NOTE — Telephone Encounter (Signed)
The patient called me back and said that he would like to re-establish care.  He said that his insurance had blocked him from having Dr. Baird Cancer as his PCP (he used to have [facility]).  He now has [employer] and needs a PCP, so he's interested in seeing either Dr. Baird Cancer or Doreene Burke as his PCP.  I told him that I would check with you all and call him back to schedule an appointment if he is able to re-establish. I'm not sure if he would need a new patient appointment or a regular follow up/CPE visit. VDM (DD)

## 2018-09-21 NOTE — Telephone Encounter (Signed)
Ok thanks.  From my understanding, he never established with another provider. He has new insurance now with [employer] and needs to have a PCP assigned to hime.

## 2018-09-21 NOTE — Telephone Encounter (Signed)
He has always seen Janece. His last visit was in April 2019. [employer] was incorrect in telling pts I was not on their panel. We explained this to patients, but some did not believe [place]. I will speak to Misti and get back with you. What is the issue going on with his new provider?

## 2018-09-26 NOTE — Telephone Encounter (Signed)
I will follow up with Misti tomorrow and get back to you.

## 2018-12-07 ENCOUNTER — Telehealth: Payer: Self-pay

## 2018-12-07 NOTE — Telephone Encounter (Signed)
Attempted to schedule pt appt, pt declined 12/07/2018

## 2018-12-07 NOTE — Telephone Encounter (Signed)
You can schedule him for an AWV, we are going to call him for a regular appt as well.

## 2018-12-08 ENCOUNTER — Telehealth: Payer: Self-pay

## 2018-12-08 NOTE — Telephone Encounter (Signed)
We have received office notes from [facility] regarding his prostate cancer. Minette Brine FNP-BC would like for pt to have an office visit so I have left pt a v/m to call the office so that we can schedule the appointment. YRL,RMA

## 2019-07-09 ENCOUNTER — Ambulatory Visit (INDEPENDENT_AMBULATORY_CARE_PROVIDER_SITE_OTHER): Payer: Medicare HMO | Admitting: Family Medicine

## 2019-07-09 ENCOUNTER — Encounter: Payer: Self-pay | Admitting: Family Medicine

## 2019-07-09 ENCOUNTER — Other Ambulatory Visit: Payer: Self-pay

## 2019-07-09 VITALS — BP 140/70 | HR 77 | Temp 98.5°F | Ht 68.75 in | Wt 181.0 lb

## 2019-07-09 DIAGNOSIS — I10 Essential (primary) hypertension: Secondary | ICD-10-CM | POA: Insufficient documentation

## 2019-07-09 DIAGNOSIS — E785 Hyperlipidemia, unspecified: Secondary | ICD-10-CM | POA: Insufficient documentation

## 2019-07-09 DIAGNOSIS — E782 Mixed hyperlipidemia: Secondary | ICD-10-CM | POA: Diagnosis not present

## 2019-07-09 DIAGNOSIS — M1990 Unspecified osteoarthritis, unspecified site: Secondary | ICD-10-CM | POA: Diagnosis not present

## 2019-07-09 DIAGNOSIS — C61 Malignant neoplasm of prostate: Secondary | ICD-10-CM | POA: Diagnosis not present

## 2019-07-09 NOTE — Assessment & Plan Note (Signed)
Will get labs. Not currently on medication, but unclear why.

## 2019-07-09 NOTE — Assessment & Plan Note (Signed)
Not currently on medication. Borderline high today. Will get labs, return in 6 months for re-check

## 2019-07-09 NOTE — Assessment & Plan Note (Signed)
Following with urology. Still getting treatment. Will obtain records

## 2019-07-09 NOTE — Progress Notes (Signed)
   Subjective:     Lawrence Wong is a 79 y.o. male presenting for New Patient (Initial Visit)     HPI  #Prostate cancer - diagnosed and started treatment 04/2018 - currently doing shots for his continued treatment - follows with [facility] - no side effects he has noticed  #Hand stiffness - notices this in the morning - difficulty morning - improves throughout  #HTN - has not been on medication recently - normally 130/70   Last saw PCP in 2019   Review of Systems   Social History   Tobacco Use  Smoking Status Former Smoker  . Packs/day: 1.00  . Years: 40.00  . Pack years: 40.00  . Types: Cigarettes  . Quit date: 06/01/1983  . Years since quitting: 36.1  Smokeless Tobacco Never Used  Tobacco Comment   States, "I have been around cigarettes my entire lift. 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 He is not ill-appearing or diaphoretic.  HENT:     Right Ear: External ear normal.     Left Ear: External ear normal.     Nose: Nose normal.  Eyes:     General: No scleral icterus.    Extraocular Movements: Extraocular movements intact.     Conjunctiva/sclera: Conjunctivae normal.  Cardiovascular:     Rate and Rhythm: Normal rate and regular rhythm.     Heart sounds: No murmur.  Pulmonary:     Effort: Pulmonary effort is normal. No respiratory distress.     Breath sounds: Normal breath sounds. No wheezing.  Musculoskeletal:     Cervical back: Neck  supple.  Skin:    General: Skin is warm and dry.  Neurological:     Mental Status: He is alert. Mental status is at baseline.  Psychiatric:        Mood and Affect: Mood normal.           Assessment & Plan:   Problem List Items Addressed This Visit      Cardiovascular and Mediastinum   Essential hypertension    Not currently on medication. Borderline high today. Will get labs, return in 6 months for re-check      Relevant Orders   Comprehensive metabolic panel     Musculoskeletal and Integument   Arthritis     Genitourinary   Malignant neoplasm of prostate ([place]) - Primary    Following with urology. Still getting treatment. Will obtain records        Other   Hyperlipidemia    Will get labs. Not currently on medication, but unclear why.       Relevant Orders   Lipid panel       Return in about 6 months (around 01/06/2020) for medicare wellness visit.  [name], MD

## 2019-07-09 NOTE — Patient Instructions (Signed)
Great to meet you today!  Get some labs today  Continue to watch your blood pressure and return in 6 months.

## 2019-07-10 ENCOUNTER — Other Ambulatory Visit: Payer: Self-pay | Admitting: Family Medicine

## 2019-07-10 LAB — COMPREHENSIVE METABOLIC PANEL
ALT: 34 U/L (ref 0–53)
AST: 28 U/L (ref 0–37)
Albumin: 4.1 g/dL (ref 3.5–5.2)
Alkaline Phosphatase: 55 U/L (ref 39–117)
BUN: 13 mg/dL (ref 6–23)
CO2: 29 mEq/L (ref 19–32)
Calcium: 10 mg/dL (ref 8.4–10.5)
Chloride: 104 mEq/L (ref 96–112)
Creatinine, Ser: 1 mg/dL (ref 0.40–1.50)
GFR: 72.3 mL/min (ref >60.00)
Glucose, Bld: 89 mg/dL (ref 70–99)
Potassium: 4.8 mEq/L (ref 3.5–5.1)
Sodium: 139 mEq/L (ref 135–145)
Total Bilirubin: 0.4 mg/dL (ref 0.2–1.2)
Total Protein: 7.3 g/dL (ref 6.0–8.3)

## 2019-07-10 LAB — LIPID PANEL
Cholesterol: 184 mg/dL (ref 0–200)
HDL: 33 mg/dL — ABNORMAL LOW (ref >39.00)
LDL Cholesterol: 118 mg/dL — ABNORMAL HIGH (ref 0–99)
NonHDL: 150.57
Total CHOL/HDL Ratio: 6
Triglycerides: 163 mg/dL — ABNORMAL HIGH (ref 0.0–149.0)
VLDL: 32.6 mg/dL (ref 0.0–40.0)

## 2019-07-10 MED ORDER — ATORVASTATIN CALCIUM 10 MG PO TABS: 10.0000 mg | ORAL_TABLET | Freq: Every day | ORAL | 3 refills | Status: DC

## 2019-07-10 NOTE — Progress Notes (Signed)
See lab result note. Left message for patient to call back

## 2019-07-10 NOTE — Progress Notes (Signed)
Patient advised. RX sent in to the pharmacy

## 2019-07-26 ENCOUNTER — Encounter: Payer: Self-pay | Admitting: Family Medicine

## 2019-07-26 DIAGNOSIS — F09 Unspecified mental disorder due to known physiological condition: Secondary | ICD-10-CM | POA: Insufficient documentation

## 2019-07-27 ENCOUNTER — Ambulatory Visit: Payer: Medicare HMO | Attending: Internal Medicine

## 2019-07-27 DIAGNOSIS — Z23 Encounter for immunization: Secondary | ICD-10-CM

## 2019-07-27 NOTE — Progress Notes (Signed)
   Covid-19 Vaccination Clinic  Name:  Lawrence Wong    MRN: [id number] DOB: 01-11-1941  07/27/2019  Lawrence Wong was observed post Covid-19 immunization for 15 minutes without incidence. He was provided with Vaccine Information Sheet and instruction to access the V-Safe system.   Lawrence Wong was instructed to call 911 with any severe reactions post vaccine: Marland Kitchen Difficulty breathing  . Swelling of your face and throat  . A fast heartbeat  . A bad rash all over your body  . Dizziness and weakness    Immunizations Administered    Name Date Dose VIS Date Route   Pfizer COVID-19 Vaccine 07/27/2019  1:48 PM 0.3 mL 05/11/2019 Intramuscular   Manufacturer: [place]   Lot: [id number]   [place]: [id number]

## 2019-08-22 ENCOUNTER — Ambulatory Visit: Payer: Medicare HMO | Attending: Internal Medicine

## 2019-08-22 DIAGNOSIS — Z23 Encounter for immunization: Secondary | ICD-10-CM

## 2019-08-22 NOTE — Progress Notes (Signed)
   Covid-19 Vaccination Clinic  Name:  Lawrence Wong    MRN: [id number] DOB: [birth year]  08/22/2019  [name] was observed post Covid-19 immunization for 15 minutes without incident. He was provided with Vaccine Information Sheet and instruction to access the V-Safe system.   [name] was instructed to call 911 with any severe reactions post vaccine: Marland Kitchen Difficulty breathing  . Swelling of face and throat  . A fast heartbeat  . A bad rash all over body  . Dizziness and weakness   Immunizations Administered    Name Date Dose VIS Date Route   Pfizer COVID-19 Vaccine 08/22/2019  1:18 PM 0.3 mL 05/11/2019 Intramuscular   Manufacturer: [place]   Lot: [id number]   [place]: [id number]

## 2020-01-02 ENCOUNTER — Other Ambulatory Visit: Payer: Self-pay

## 2020-01-02 ENCOUNTER — Ambulatory Visit (INDEPENDENT_AMBULATORY_CARE_PROVIDER_SITE_OTHER): Payer: Medicare HMO

## 2020-01-02 DIAGNOSIS — Z0000 Encounter for general adult medical examination without abnormal findings: Secondary | ICD-10-CM | POA: Diagnosis not present

## 2020-01-02 NOTE — Patient Instructions (Signed)
Lawrence Wong , Thank you for taking time to come for your Medicare Wellness Visit. I appreciate your ongoing commitment to your health goals. Please review the following plan we discussed and let me know if I can assist you in the future.   Screening recommendations/referrals: Colonoscopy: no longer required Recommended yearly ophthalmology/optometry visit for glaucoma screening and checkup Recommended yearly dental visit for hygiene and checkup  Vaccinations: Influenza vaccine: due, will be available in the office at the end of August Pneumococcal vaccine: Completed series Tdap vaccine: Up to date, completed 12/27/2011, due 11/2021 Shingles vaccine: #2 due, Patient wants to receive this at his physical next week   Covid-19: Completed series  Advanced directives: Advance directive discussed with you today. Even though you declined this today please call our office should you change your mind and we can give you the proper paperwork for you to fill out.   Conditions/risks identified: hypertension, hyperlipidemia  Next appointment: Follow up in one year for your annual wellness visit.   Preventive Care 78 Years and Older, Male Preventive care refers to lifestyle choices and visits with your health care provider that can promote health and wellness. What does preventive care include?  A yearly physical exam. This is also called an annual well check.  Dental exams once or twice a year.  Routine eye exams. Ask your health care provider how often you should have your eyes checked.  Personal lifestyle choices, including:  Daily care of your teeth and gums.  Regular physical activity.  Eating a healthy diet.  Avoiding tobacco and drug use.  Limiting alcohol use.  Practicing safe sex.  Taking low doses of aspirin every day.  Taking vitamin and mineral supplements as recommended by your health care provider. What happens during an annual well check? The services and screenings  done by your health care provider during your annual well check will depend on your age, overall health, lifestyle risk factors, and family history of disease. Counseling  Your health care provider may ask you questions about your:  Alcohol use.  Tobacco use.  Drug use.  Emotional well-being.  Home and relationship well-being.  Sexual activity.  Eating habits.  History of falls.  Memory and ability to understand (cognition).  Work and work Statistician. Screening  You may have the following tests or measurements:  Height, weight, and BMI.  Blood pressure.  Lipid and cholesterol levels. These may be checked every 5 years, or more frequently if you are over 78 years old.  Skin check.  Lung cancer screening. You may have this screening every year starting at age 78 if you have a 30-pack-year history of smoking and currently smoke or have quit within the past 15 years.  Fecal occult blood test (FOBT) of the stool. You may have this test every year starting at age 78.  Flexible sigmoidoscopy or colonoscopy. You may have a sigmoidoscopy every 5 years or a colonoscopy every 10 years starting at age 78.  Prostate cancer screening. Recommendations will vary depending on your family history and other risks.  Hepatitis C blood test.  Hepatitis B blood test.  Sexually transmitted disease (STD) testing.  Diabetes screening. This is done by checking your blood sugar (glucose) after you have not eaten for a while (fasting). You may have this done every 1-3 years.  Abdominal aortic aneurysm (AAA) screening. You may need this if you are a current or former smoker.  Osteoporosis. You may be screened starting at age 78 if you are  at high risk. Talk with your health care provider about your test results, treatment options, and if necessary, the need for more tests. Vaccines  Your health care provider may recommend certain vaccines, such as:  Influenza vaccine. This is recommended  every year.  Tetanus, diphtheria, and acellular pertussis (Tdap, Td) vaccine. You may need a Td booster every 10 years.  Zoster vaccine. You may need this after age 78.  Pneumococcal 13-valent conjugate (PCV13) vaccine. One dose is recommended after age 78.  Pneumococcal polysaccharide (PPSV23) vaccine. One dose is recommended after age 78. Talk to your health care provider about which screenings and vaccines you need and how often you need them. This information is not intended to replace advice given to you by your health care provider. Make sure you discuss any questions you have with your health care provider. Document Released: 06/13/2015 Document Revised: 02/04/2016 Document Reviewed: 03/18/2015 Elsevier Interactive Patient Education  2017 [place] Prevention in the Home Falls can cause injuries. They can happen to people of all ages. There are many things you can do to make your home safe and to help prevent falls. What can I do on the outside of my home?  Regularly fix the edges of walkways and driveways and fix any cracks.  Remove anything that might make you trip as you walk through a door, such as a raised step or threshold.  Trim any bushes or trees on the path to your home.  Use bright outdoor lighting.  Clear any walking paths of anything that might make someone trip, such as rocks or tools.  Regularly check to see if handrails are loose or broken. Make sure that both sides of any steps have handrails.  Any raised decks and porches should have guardrails on the edges.  Have any leaves, snow, or ice cleared regularly.  Use sand or salt on walking paths during winter.  Clean up any spills in your garage right away. This includes oil or grease spills. What can I do in the bathroom?  Use night lights.  Install grab bars by the toilet and in the tub and shower. Do not use towel bars as grab bars.  Use non-skid mats or decals in the tub or shower.  If  you need to sit down in the shower, use a plastic, non-slip stool.  Keep the floor dry. Clean up any water that spills on the floor as soon as it happens.  Remove soap buildup in the tub or shower regularly.  Attach bath mats securely with double-sided non-slip rug tape.  Do not have throw rugs and other things on the floor that can make you trip. What can I do in the bedroom?  Use night lights.  Make sure that you have a light by your bed that is easy to reach.  Do not use any sheets or blankets that are too big for your bed. They should not hang down onto the floor.  Have a firm chair that has side arms. You can use this for support while you get dressed.  Do not have throw rugs and other things on the floor that can make you trip. What can I do in the kitchen?  Clean up any spills right away.  Avoid walking on wet floors.  Keep items that you use a lot in easy-to-reach places.  If you need to reach something above you, use a strong step stool that has a grab bar.  Keep electrical cords out of  the way.  Do not use floor polish or wax that makes floors slippery. If you must use wax, use non-skid floor wax.  Do not have throw rugs and other things on the floor that can make you trip. What can I do with my stairs?  Do not leave any items on the stairs.  Make sure that there are handrails on both sides of the stairs and use them. Fix handrails that are broken or loose. Make sure that handrails are as long as the stairways.  Check any carpeting to make sure that it is firmly attached to the stairs. Fix any carpet that is loose or worn.  Avoid having throw rugs at the top or bottom of the stairs. If you do have throw rugs, attach them to the floor with carpet tape.  Make sure that you have a light switch at the top of the stairs and the bottom of the stairs. If you do not have them, ask someone to add them for you. What else can I do to help prevent falls?  Wear shoes  that:  Do not have high heels.  Have rubber bottoms.  Are comfortable and fit you well.  Are closed at the toe. Do not wear sandals.  If you use a stepladder:  Make sure that it is fully opened. Do not climb a closed stepladder.  Make sure that both sides of the stepladder are locked into place.  Ask someone to hold it for you, if possible.  Clearly mark and make sure that you can see:  Any grab bars or handrails.  First and last steps.  Where the edge of each step is.  Use tools that help you move around (mobility aids) if they are needed. These include:  Canes.  Walkers.  Scooters.  Crutches.  Turn on the lights when you go into a dark area. Replace any light bulbs as soon as they burn out.  Set up your furniture so you have a clear path. Avoid moving your furniture around.  If any of your floors are uneven, fix them.  If there are any pets around you, be aware of where they are.  Review your medicines with your doctor. Some medicines can make you feel dizzy. This can increase your chance of falling. Ask your doctor what other things that you can do to help prevent falls. This information is not intended to replace advice given to you by your health care provider. Make sure you discuss any questions you have with your health care provider. Document Released: 03/13/2009 Document Revised: 10/23/2015 Document Reviewed: 06/21/2014 Elsevier Interactive Patient Education  2017 [employer].

## 2020-01-02 NOTE — Progress Notes (Signed)
Subjective:   Lawrence Wong is a 79 y.o. male who presents for Medicare Annual/Subsequent preventive examination.  Review of Systems: N/A      I connected with the patient today by telephone and verified that I am speaking with the correct person using two identifiers. Location patient: home Location nurse: work Persons participating in the virtual visit: patient, Marine scientist.   I discussed the limitations, risks, security and privacy concerns of performing an evaluation and management service by telephone and the availability of in person appointments. I also discussed with the patient that there may be a patient responsible charge related to this service. The patient expressed understanding and verbally consented to this telephonic visit.    Interactive audio and video telecommunications were attempted between this nurse and patient, however failed, due to patient having technical difficulties OR patient did not have access to video capability.  We continued and completed visit with audio only.     Cardiac Risk Factors include: advanced age (>66men, >59 women);male gender;hypertension;dyslipidemia     Objective:    Today's Vitals   There is no height or weight on file to calculate BMI.  Advanced Directives 01/02/2020 01/05/2018  Does Patient Have a Medical Advance Directive? No No  Would patient like information on creating a medical advance directive? No - Patient declined Yes (MAU/Ambulatory/Procedural Areas - Information given)    Current Medications (verified) Outpatient Encounter Medications as of 01/02/2020  Medication Sig  . atorvastatin (LIPITOR) 10 MG tablet Take 1 tablet (10 mg total) by mouth daily.   No facility-administered encounter medications on file as of 01/02/2020.    Allergies (verified) Patient has no known allergies.   History: Past Medical History:  Diagnosis Date  . Allergy   . Arthritis   . Asthma   . Chicken pox   . Chronic kidney disease   . Colon  polyps   . GERD (gastroesophageal reflux disease)   . Heart murmur   . Hyperlipidemia   . Hypertension   . Prostate cancer ([place])   . Syphilis    Past Surgical History:  Procedure Laterality Date  . INGUINAL LYMPH NODE BIOPSY    . PROSTATE BIOPSY     Family History  Problem Relation Age of Onset  . Heart disease Mother   . Cancer Neg Hx    Social History   Socioeconomic History  . Marital status: Single    Spouse name: Not on file  . Number of children: 1  . Years of education: Not on file  . Highest education level: Not on file  Occupational History  . Occupation: retired  Tobacco Use  . Smoking status: Former Smoker    Packs/day: 1.00    Years: 40.00    Pack years: 40.00    Types: Cigarettes    Quit date: 06/01/1983    Years since quitting: 36.6  . Smokeless tobacco: Never Used  . Tobacco comment: States, "I have been around cigarettes my entire lift. 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 Vaping Use: Never used  Substance and Sexual Activity  . Alcohol use: Not Currently    Comment: quit years ago  . Drug use: Never  . Sexual activity: Not Currently  Other Topics Concern  . 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 Difficulty of Paying Living Expenses: Not hard at all  Food Insecurity: No Food Insecurity  . Worried About Charity fundraiser in the Last Year: Never true    . Ran Out of Food in the Last Year: Never true  Transportation Needs: No Transportation Needs  . Lack of Transportation (Medical): No  . Lack of Transportation (Non-Medical): No  Physical Activity: Inactive  . Days of Exercise per Week: 0 days  . Minutes of Exercise per Session: 0 min  Stress: No Stress Concern Present  . Feeling of Stress : Not at all  Social Connections:   . Frequency of Communication with Friends and Family:   . Frequency of Social Gatherings with Friends and Family:   . Attends Religious Services:   . Active Member of Clubs or Organizations:   . Attends Archivist Meetings:   Marland Kitchen Marital Status:     Tobacco Counseling Counseling given: Not Answered Comment: States, "I have been around cigarettes my entire lift. 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  No  Does the patient have any non-healing wounds?  No  Has the patient had any unintentional weight loss or weight gain?  No   Diabetes:  Is the patient diabetic?  No  If diabetic, was a CBG obtained today?  N/A Did the patient bring in their glucometer from home?  N/A How often do you monitor your CBG's? N/A.   Financial Strains and Diabetes Management:  Are you having any financial strains with the device, your supplies or your medication? N/A.  Does the patient want to be seen by Chronic Care Management for management of their diabetes?  N/A Would the patient like to be referred to a Nutritionist or for Diabetic Management?   N/A     Interpreter Needed?: No  Information entered by :: CJohnson, LPN   Activities of Daily Living In your present state of health, do you have any difficulty performing the following activities: 01/02/2020  Hearing? Y  Comment some hearing loss noted with certain tones  Vision? N  Difficulty concentrating or making decisions? N  Walking or climbing stairs? N  Dressing or bathing? N  Doing errands, shopping? N  Preparing Food and eating ? N  Using the Toilet? N  In the past six months, have you accidently leaked urine? N  Do you have problems with loss of bowel control?  N  Managing your Medications? N  Managing your Finances? N  Housekeeping or managing your Housekeeping? N  Some recent data might be hidden    Patient Care Team: [name], MD as PCP - General (Family Medicine)  Indicate any recent Medical Services you may have received from other than [facility] providers in the past year (date may be approximate).     Assessment:   This is a routine wellness examination for Mahari.  Hearing/Vision screen  Hearing Screening   125Hz  250Hz  500Hz  1000Hz  2000Hz  3000Hz  4000Hz  6000Hz  8000Hz   Right ear:           Left ear:           Vision Screening Comments: Advised patient to get annual eye exams   Dietary issues and exercise activities discussed: Current Exercise Habits: The patient does not participate in regular exercise at present, Exercise limited by: None identified  Goals    . Patient Stated     01/02/2020, I will maintain and continue medications as prescribed.       Depression Screen PHQ 2/9 Scores 01/02/2020  PHQ - 2 Score 0  PHQ- 9 Score 0    Fall Risk Fall Risk  01/02/2020  Falls in the past year? 0  Number falls in past yr: 0  Injury with Fall? 0  Risk for fall due to : No Fall Risks  Follow up Falls evaluation completed;Falls prevention discussed    Any stairs in or around the home? Yes  If so, are there any without handrails? No  Home free  of loose throw rugs in walkways, pet beds, electrical cords, etc? Yes  Adequate lighting in your home to reduce risk of falls? Yes   ASSISTIVE DEVICES UTILIZED TO PREVENT FALLS:  Life alert? No  Use of a cane, walker or w/c? No  Grab bars in the bathroom? No  Shower chair or bench in shower? No  Elevated toilet seat or a handicapped toilet? No   TIMED UP AND GO:  Was the test performed? N/A, telephonic visit.    Cognitive Function: MMSE - Mini Mental State Exam 01/02/2020  Orientation to time 5  Orientation to Place 5  Registration 3  Attention/ Calculation 5  Recall 3  Language- repeat 1       Mini Cog  Mini-Cog screen was completed. Maximum score is 22. A value of 0 denotes this part of the MMSE was not completed or the patient failed this part of the Mini-Cog screening.  Immunizations Immunization History  Administered Date(s) Administered  . Influenza, High Dose Seasonal PF 03/29/2018  . Influenza-Unspecified 03/01/2019  . PFIZER SARS-COV-2 Vaccination 07/27/2019, 08/22/2019  . Pneumococcal Conjugate-13 01/08/2016  . Pneumococcal Polysaccharide-23 12/27/2011  . Tdap 12/27/2011  . Zoster Recombinat (Shingrix) 03/28/2017    TDAP status: Up to date Flu Vaccine status: due, will be available in the office at the end of August  Pneumococcal vaccine status: Up to date Covid-19 vaccine status: Completed vaccines  Qualifies for Shingles Vaccine? Yes   Zostavax completed No   Shingrix Completed?: #2 is due. Patient wants to receive this in the office next week at his physical.  Screening Tests Health Maintenance  Topic Date Due  . Hepatitis C Screening  Never done  . INFLUENZA VACCINE  12/30/2019  . TETANUS/TDAP  12/26/2021  . COVID-19 Vaccine  Completed  . PNA vac Low Risk Adult  Completed    Health Maintenance  Health Maintenance Due  Topic Date Due  .  Hepatitis C Screening  Never done  . INFLUENZA VACCINE  12/30/2019    Colorectal cancer screening: No  longer required.   Lung Cancer Screening: (Low Dose CT Chest recommended if Age 45-80 years, 30 pack-year currently smoking OR have quit w/in 15 years.) does not qualify.    Additional Screening:  Hepatitis C Screening: does qualify; Completed due  Vision Screening: Recommended annual ophthalmology exams for early detection of glaucoma and other disorders of the eye. Is the patient up to date with their annual eye exam?  No  Who is the provider or what is the name of the office in which the patient attends annual eye exams? Patient states he will set up an appointment with a doctor soon. If pt is not established with a provider, would they like to be referred to a provider to establish care? No .   Dental Screening: Recommended annual dental exams for proper oral hygiene  Community Resource Referral / Chronic Care Management: CRR required this visit?  No   CCM required this visit?  No      Plan:     I have personally reviewed and noted the following in the patient's chart:   . Medical and social history . Use of alcohol, tobacco or illicit drugs  . Current medications and supplements . Functional ability and status . Nutritional status . Physical activity . Advanced directives . List of other physicians . Hospitalizations, surgeries, and ER visits in previous 12 months . Vitals . Screenings to include cognitive, depression, and falls . Referrals and appointments  In addition, I have reviewed and discussed with patient certain preventive protocols, quality metrics, and best practice recommendations. A written personalized care plan for preventive services as well as general preventive health recommendations were provided to patient.   Due to this being a telephonic visit, the after visit summary with patients personalized plan was offered to patient via mail or my-chart. Patient preferred to pick up at office at next visit.   Andrez Grime, LPN   07/31/9922

## 2020-01-02 NOTE — Progress Notes (Signed)
PCP notes:  Health Maintenance: Shingrix #2- due, Patient wants to receive this next week at his appointment   Abnormal Screenings: none   Patient concerns: none   Nurse concerns: none   Next PCP appt.: 01/10/2020 @ 2:20 pm

## 2020-01-07 ENCOUNTER — Ambulatory Visit: Payer: Medicare HMO

## 2020-01-08 NOTE — Progress Notes (Signed)
I reviewed health advisor's note, was available for consultation, and agree with documentation and plan.  

## 2020-01-10 ENCOUNTER — Ambulatory Visit (INDEPENDENT_AMBULATORY_CARE_PROVIDER_SITE_OTHER): Payer: Medicare HMO | Admitting: Family Medicine

## 2020-01-10 ENCOUNTER — Other Ambulatory Visit: Payer: Self-pay

## 2020-01-10 ENCOUNTER — Encounter: Payer: Self-pay | Admitting: Family Medicine

## 2020-01-10 VITALS — BP 120/80 | HR 73 | Temp 97.9°F | Ht 70.25 in | Wt 173.5 lb

## 2020-01-10 DIAGNOSIS — I10 Essential (primary) hypertension: Secondary | ICD-10-CM

## 2020-01-10 DIAGNOSIS — R7303 Prediabetes: Secondary | ICD-10-CM | POA: Diagnosis not present

## 2020-01-10 DIAGNOSIS — L989 Disorder of the skin and subcutaneous tissue, unspecified: Secondary | ICD-10-CM

## 2020-01-10 DIAGNOSIS — Z0000 Encounter for general adult medical examination without abnormal findings: Secondary | ICD-10-CM

## 2020-01-10 DIAGNOSIS — I700 Atherosclerosis of aorta: Secondary | ICD-10-CM | POA: Diagnosis not present

## 2020-01-10 DIAGNOSIS — E782 Mixed hyperlipidemia: Secondary | ICD-10-CM

## 2020-01-10 DIAGNOSIS — Z1159 Encounter for screening for other viral diseases: Secondary | ICD-10-CM

## 2020-01-10 NOTE — Progress Notes (Signed)
Annual Exam   Chief Complaint:  Chief Complaint  Patient presents with  . Medicare Wellness    part 2  . blackheads    breakouts on neck and face     History of Present Illness:  Lawrence Wong is a 79 y.o. presents today for annual examination.    Blackheads - not sure if these are ticks or blackheads -   Nutrition/Lifestyle Diet: regular meals Exercise: working on a farm everyday He is not sexually active.  Any issues with getting or keeping erection? Yes - since prostate cancer - has a check up coming up  Social History   Tobacco Use  Smoking Status Former Smoker  . Packs/day: 1.00  . Years: 40.00  . Pack years: 40.00  . Types: Cigarettes  . Quit date: 06/01/1983  . Years since quitting: 36.6  Smokeless Tobacco Never Used  Tobacco Comment   States, "I have been around cigarettes my entire lift. 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     The patient feels safe at home and in their relationships: yes.  General Health Dentist in the last year: Yes Eye doctor: has not had an appointment recently  Weight Wt Readings from Last 3 Encounters:  01/10/20 173 lb 8 oz (78.7 kg)  07/09/19 181 lb (82.1 kg)  02/09/18 164 lb 3.2 oz (74.5 kg)   Patient has normal BMI  BMI Readings from Last 1 Encounters:  01/10/20 24.72 kg/m     Chronic disease screening Blood pressure monitoring:  BP Readings from Last 3 Encounters:  01/10/20 120/80  07/09/19 140/70  02/09/18 (!) 144/86    Lipid Monitoring: Indication for screening: age >35, obesity, diabetes, family hx, CV risk factors.  Lipid screening: Yes  Lab Results  Component Value Date   CHOL 184 07/09/2019   HDL 33.00 (L) 07/09/2019   LDLCALC 118 (H) 07/09/2019   TRIG 163.0 (H)  07/09/2019   CHOLHDL 6 07/09/2019     Diabetes Screening: age >46, overweight, family hx, PCOS, hx of gestational diabetes, at risk ethnicity, elevated blood pressure >135/80.  Diabetes Screening screening: Yes  Lab Results  Component Value Date   HGBA1C 5.8 09/15/2017     Prostate Cancer Screening: Yes Age 21-69 yo Shared Decision Making Higher Risk: Older age, African American, Family Hx of Prostate Cancer - Yes Benefits: screening may prevent 1.3 deaths from prostate cancer over 13 years per 1000 men screened and prevent 3 metastatic cases per 1000 men screened. Not enough evidence to support more benefit for AA or [place] Harms: False Positive and psychological harms. 15% of me with false positive over a 2 to 4 year period > resulting in biopsy and complications such as pain, hematospermia, infections. Overdiagnosis - increases with age - found that 20-50% of prostate cancer through screening may have never caused any issues. Harms of treatment include - erectile dysfunction, urinary incontinence, and bothersome bowel symptoms.   After discussion he does want to get a PSA checked today.   Inadequate evidence for screening <55 No mortality benefit for screening >70   Lab Results  Component Value Date   PSA 28.9 09/15/2017   Already being followed and treated     Social History  Tobacco Use  Smoking Status Former Smoker  . Packs/day: 1.00  . Years: 40.00  . Pack years: 40.00  . Types: Cigarettes  . Quit date: 06/01/1983  . Years since quitting: 36.6  Smokeless Tobacco Never Used  Tobacco Comment   States, "I have been around cigarettes my entire lift. 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 Yes Current Tobacco user? No Quit less than 15 years ago? No Interested in low dose CT for lung cancer screening? no  Abdominal Aortic Aneurysm:  Age 61-75, 1 time screening, men who have ever smoked  had CT with normal size    Past Medical History:  Diagnosis Date  . Allergy   . Arthritis   . Asthma   . Chicken pox   . Chronic kidney disease   . Colon polyps   . GERD (gastroesophageal reflux disease)   . Heart murmur   . Hyperlipidemia   . Hypertension   . Prostate cancer ([place])   . Syphilis     Past Surgical History:  Procedure Laterality Date  . INGUINAL LYMPH NODE BIOPSY    . PROSTATE BIOPSY      Prior to Admission medications   Medication Sig Start Date End Date Taking? Authorizing Provider  atorvastatin (LIPITOR) 10 MG tablet Take 1 tablet (10 mg total) by mouth daily. 07/10/19  Yes [name], MD    No Known Allergies   Social History   Socioeconomic History  . Marital status: Single    Spouse name: Not on file  . Number of children: 1  . Years of education: Not on file  . Highest education level: Not on file  Occupational History  . Occupation: retired  Tobacco Use  . Smoking status: Former Smoker    Packs/day: 1.00    Years: 40.00    Pack years: 40.00    Types: Cigarettes    Quit date: 06/01/1983    Years since quitting: 36.6  . Smokeless tobacco: Never Used  . Tobacco comment: States, "I have been around cigarettes my entire lift. 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 Vaping Use: Never used  Substance and Sexual Activity  . Alcohol use: Not Currently    Comment: quit years ago  . Drug use: Never  . Sexual activity: Not Currently  Other Topics Concern  . 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 Difficulty of Paying Living Expenses: Not hard at all  Food Insecurity: No Food Insecurity  . Worried About Charity fundraiser in the Last Year: Never true  . Ran Out of Food  in the Last Year: Never true  Transportation Needs: No Transportation Needs  . Lack of Transportation (Medical): No  . Lack of Transportation (Non-Medical): No  Physical Activity: Inactive  . Days of Exercise per Week: 0 days  . Minutes of Exercise per Session: 0 min  Stress: No Stress Concern Present  . Feeling of Stress : Not at all  Social Connections:   . Frequency of Communication with Friends and Family:   . Frequency of Social Gatherings with Friends and Family:   . Attends Religious Services:   . Active Member of Clubs or Organizations:   . Attends Archivist Meetings:   Marland Kitchen Marital Status:   Intimate Partner Violence: Not At Risk  . Fear of Current or Ex-Partner: No  . Emotionally Abused: No  . Physically Abused: No  . Sexually Abused: No    Family History  Problem Relation Age of Onset  . Heart disease Mother   . Cancer Neg Hx     Review of Systems  Constitutional: Negative for chills and fever.  HENT: Negative for congestion and sore throat.   Eyes: Negative for blurred vision and double vision.  Respiratory: Negative for shortness of breath.   Cardiovascular: Negative for chest pain.  Gastrointestinal: Negative for heartburn, nausea and vomiting.  Genitourinary: Negative.   Musculoskeletal: Negative.  Negative for myalgias.  Skin: Negative for rash.  Neurological: Negative for dizziness and headaches.  Endo/Heme/Allergies: Does not bruise/bleed easily.  Psychiatric/Behavioral: Negative for depression. The patient is not nervous/anxious.      Physical Exam BP 120/80   Pulse 73   Temp 97.9 F (36.6 C) (Temporal)   Ht 5'  10.25" (1.784 m)   Wt 173 lb 8 oz (78.7 kg)   SpO2 95%   BMI 24.72 kg/m    BP Readings from Last 3 Encounters:  01/10/20 120/80  07/09/19 140/70  02/09/18 (!) 144/86      Physical Exam Constitutional:      General: He is not in acute distress.    Appearance: He is well-developed. He is not diaphoretic.  HENT:     Head: Normocephalic and atraumatic.     Right Ear: Tympanic membrane and ear canal normal.     Left Ear: Tympanic membrane and ear canal normal.     Nose: Nose normal.     Mouth/Throat:     Pharynx: Uvula midline.  Eyes:     General: No scleral icterus.    Conjunctiva/sclera: Conjunctivae normal.     Pupils: Pupils are equal, round, and reactive to light.  Cardiovascular:     Rate and Rhythm: Normal rate and regular rhythm.     Heart sounds: Normal heart sounds. No murmur heard.   Pulmonary:     Effort: Pulmonary effort is normal. No respiratory distress.     Breath sounds: Normal breath sounds. No wheezing.  Abdominal:     General: Bowel sounds are normal. There is no distension.     Palpations: Abdomen is soft. There is no mass.     Tenderness: There is no abdominal tenderness. There is no guarding.  Musculoskeletal:        General: Normal range of motion.     Cervical back: Normal range of motion and neck supple.  Lymphadenopathy:     Cervical: No cervical adenopathy.  Skin:    General: Skin is warm and dry.     Capillary Refill: Capillary refill takes less than 2 seconds.     Comments: Face  and neck with several age spots as well as small skin tags. Right hip with flesh colored raised mobile papule though without tapering base.   Neurological:     Mental Status: He is alert and oriented to person, place, and time.        Results:  PHQ-9:    Clinical Support from 01/02/2020 in [place] at [facility]  PHQ-9 Total Score 0        Assessment: 79 y.o. here for routine annual physical examination.  Plan: Problem List Items  Addressed This Visit      Cardiovascular and Mediastinum   Essential hypertension   Atherosclerosis of aorta ([place])    Noted on CT. On statin and ASA        Musculoskeletal and Integument   Skin lesions    Discussed several benign skin lesions - neurofibroma vs skin tag. Could remove here or have him see dermatology if becoming symptomatic.         Other   Hyperlipidemia    Started statin in February. Repeat labs today      Relevant Orders   Lipid panel   Hepatic Function Panel    Other Visit Diagnoses    Annual physical exam    -  Primary   Prediabetes       Relevant Orders   Hemoglobin A1c   Encounter for hepatitis C screening test for low risk patient       Relevant Orders   Hepatitis C antibody      Screening: -- Blood pressure screen normal -- cholesterol screening: follow-up after treatment -- Weight screening: normal -- Diabetes Screening: prediabetes - repeat screening -- Nutrition: normal - Encouraged healthy diet  The 10-year ASCVD risk score Mikey Bussing DC Jr., et al., 2013) is: 14.9%   Values used to calculate the score:     Age: 28 years     Sex: Male     Is Non-Hispanic African American: Yes     Diabetic: No     Tobacco smoker: No     Systolic Blood Pressure: 974 mmHg     Is BP treated: No     HDL Cholesterol: 33 mg/dL     Total Cholesterol: 184 mg/dL  -- ASA 81 mg discussed if CVD risk >10% age 55-59 and willing to take for 10 years -- Statin therapy for Age 44-75 with CVD risk >7.5%  Psych -- Depression screening (PHQ-9): negative  Safety -- tobacco screening: not using -- alcohol screening:  low-risk usage. -- no evidence of domestic violence or intimate partner violence.  Cancer Screening -- Prostate (age 47-69) currently in treatment -- Colon (age 77-75) not indicated -- Lung not indicated   Immunizations Immunization History  Administered Date(s) Administered  . Influenza, High Dose Seasonal PF 03/29/2018  . Influenza-Unspecified  03/01/2019  . PFIZER SARS-COV-2 Vaccination 07/27/2019, 08/22/2019  . Pneumococcal Conjugate-13 01/08/2016  . Pneumococcal Polysaccharide-23 12/27/2011  . Tdap 12/27/2011  . Zoster Recombinat (Shingrix) 03/28/2017    -- flu vaccine advised getting -- TDAP q10 years up to date -- Shingles (age >38) advised getting 2nd shot at pharmacy -- PPSV-23 (19-64 with chronic disease or smoking) up to date -- PCV-13 (age >73) - one dose followed by PPSV-23 1 year later up to date -- Covid-19 Vaccine up to date  Encouraged regular vision and dental screening. Encouraged healthy exercise and diet.   [name]

## 2020-01-10 NOTE — Assessment & Plan Note (Signed)
Discussed several benign skin lesions - neurofibroma vs skin tag. Could remove here or have him see dermatology if becoming symptomatic.

## 2020-01-10 NOTE — Assessment & Plan Note (Signed)
Started statin in February. Repeat labs today

## 2020-01-10 NOTE — Assessment & Plan Note (Signed)
Noted on CT. On statin and ASA

## 2020-01-10 NOTE — Patient Instructions (Addendum)
Repeat blood work  Call the pharmacy to schedule second shingles shot  If skin lesions bothering you let me know  - we can either plan for removal here or a referral to dermatology  They are non-cancerous lesions

## 2020-01-11 LAB — HEPATIC FUNCTION PANEL
ALT: 30 U/L (ref 0–53)
AST: 27 U/L (ref 0–37)
Albumin: 4.3 g/dL (ref 3.5–5.2)
Alkaline Phosphatase: 56 U/L (ref 39–117)
Bilirubin, Direct: 0.1 mg/dL (ref 0.0–0.3)
Total Bilirubin: 0.7 mg/dL (ref 0.2–1.2)
Total Protein: 7.2 g/dL (ref 6.0–8.3)

## 2020-01-11 LAB — HEMOGLOBIN A1C: Hgb A1c MFr Bld: 6.2 % (ref 4.6–6.5)

## 2020-01-11 LAB — HEPATITIS C ANTIBODY
Hepatitis C Ab: NONREACTIVE
SIGNAL TO CUT-OFF: 0.07 (ref <1.00)

## 2020-01-11 LAB — LIPID PANEL
Cholesterol: 124 mg/dL (ref 0–200)
HDL: 30.5 mg/dL — ABNORMAL LOW (ref >39.00)
LDL Cholesterol: 69 mg/dL (ref 0–99)
NonHDL: 93.45
Total CHOL/HDL Ratio: 4
Triglycerides: 120 mg/dL (ref 0.0–149.0)
VLDL: 24 mg/dL (ref 0.0–40.0)

## 2020-03-24 ENCOUNTER — Other Ambulatory Visit (HOSPITAL_COMMUNITY): Payer: Self-pay | Admitting: Urology

## 2020-03-24 DIAGNOSIS — C61 Malignant neoplasm of prostate: Secondary | ICD-10-CM

## 2020-03-24 DIAGNOSIS — R9721 Rising PSA following treatment for malignant neoplasm of prostate: Secondary | ICD-10-CM

## 2020-04-03 ENCOUNTER — Encounter (HOSPITAL_COMMUNITY): Payer: Self-pay

## 2020-04-03 ENCOUNTER — Ambulatory Visit (HOSPITAL_COMMUNITY): Admission: RE | Admit: 2020-04-03 | Payer: Medicare HMO | Source: Ambulatory Visit

## 2020-05-14 ENCOUNTER — Other Ambulatory Visit: Payer: Self-pay | Admitting: Urology

## 2020-05-14 ENCOUNTER — Other Ambulatory Visit (HOSPITAL_COMMUNITY): Payer: Self-pay | Admitting: Urology

## 2020-05-14 DIAGNOSIS — C61 Malignant neoplasm of prostate: Secondary | ICD-10-CM

## 2020-05-14 DIAGNOSIS — R9721 Rising PSA following treatment for malignant neoplasm of prostate: Secondary | ICD-10-CM

## 2020-05-22 ENCOUNTER — Encounter (HOSPITAL_COMMUNITY)
Admission: RE | Admit: 2020-05-22 | Discharge: 2020-05-22 | Disposition: A | Discharge status: Home / Self Care | Payer: Medicare HMO | Source: Ambulatory Visit | Attending: Urology | Admitting: Urology

## 2020-05-22 ENCOUNTER — Other Ambulatory Visit: Payer: Self-pay

## 2020-05-22 DIAGNOSIS — R9721 Rising PSA following treatment for malignant neoplasm of prostate: Secondary | ICD-10-CM

## 2020-05-22 DIAGNOSIS — C61 Malignant neoplasm of prostate: Secondary | ICD-10-CM

## 2020-05-22 MED ORDER — TECHNETIUM TC 99M MEDRONATE IV KIT
20.0000 | PACK | Freq: Once | INTRAVENOUS | Status: AC | PRN
  Administered 2020-05-22: 20 via INTRAVENOUS

## 2020-06-06 ENCOUNTER — Encounter: Payer: Self-pay | Admitting: Medical Oncology

## 2020-06-06 DIAGNOSIS — R9721 Rising PSA following treatment for malignant neoplasm of prostate: Secondary | ICD-10-CM | POA: Diagnosis not present

## 2020-06-06 NOTE — Progress Notes (Signed)
I called pt to introduce myself as the Prostate Nurse Navigator and the Coordinator of the Prostate [place].  1. I confirmed with the patient he is aware of his referral to the clinic 06/13/2020, arriving@ 8 am.  2. I discussed the format of the clinic and the physicians he will be seeing that day.  3. I discussed where the clinic is located and how to contact me. I discussed the [place] protocol.  4. I confirmed his address and informed him I would be mailing a packet of information and forms to be completed. I asked him to bring them with him the day of his appointment.   He voiced understanding of the above. I asked him to call me if he has any questions or concerns regarding his appointments or the forms he needs to complete.

## 2020-06-10 ENCOUNTER — Encounter: Payer: Self-pay | Admitting: Medical Oncology

## 2020-06-12 ENCOUNTER — Encounter: Payer: Self-pay | Admitting: Radiation Oncology

## 2020-06-12 ENCOUNTER — Encounter: Payer: Self-pay | Admitting: Medical Oncology

## 2020-06-12 DIAGNOSIS — Z86010 Personal history of colonic polyps: Secondary | ICD-10-CM | POA: Insufficient documentation

## 2020-06-12 DIAGNOSIS — K219 Gastro-esophageal reflux disease without esophagitis: Secondary | ICD-10-CM | POA: Insufficient documentation

## 2020-06-12 NOTE — Progress Notes (Addendum)
GU Location of Tumor / Histology: prostatic adenocarcinoma  If Prostate Cancer, Gleason Score is (4 + 3) and PSA is (32.7) at diagnosis (10/28/17).   Lawrence Wong was diagnosed with prostate cancer 10/28/2017. Patient s/p firmagon, lupron and 40 fx EBRT. Unfortunately, PSA continues to rise.   08/10/19 PSA 0.54 11/14/19 PSA 1.80 01/16/20 psa 2.03  Biopsies of prostate (if applicable) revealed:    Past/Anticipated interventions by urology, if any: prostate biopsy, diagnosis, surveillance, lupron, firmagon, CT abd/pelvis (negative), Bone scan (negative), referral to [facility]  Past/Anticipated interventions by medical oncology, if any: no  Weight changes, if any: denies  Bowel/Bladder complaints, if any: IPSS 1. SHIM 6. Denies dysuria and hematuria. Reports urinary leakage and rare incontinence. Denies any bowel complaints.   Nausea/Vomiting, if any: denies  Pain issues, if any:  Reports intermittent cramping in bilateral legs and toes  SAFETY ISSUES:  Prior radiation? yes, 40 EBRT  Pacemaker/ICD? denies  Possible current pregnancy? no, male patient  Is the patient on methotrexate? denies  Current Complaints / other details:  80 year old male. Single Retired. Resides in [place].  Reports having hearing loss, sinus problems, mouth sores, shortness of breath, heartburn, arthritis, weakness, and forgetfulness.

## 2020-06-13 ENCOUNTER — Encounter: Payer: Self-pay | Admitting: General Practice

## 2020-06-13 ENCOUNTER — Inpatient Hospital Stay: Payer: Medicare HMO | Attending: Oncology | Admitting: Oncology

## 2020-06-13 ENCOUNTER — Ambulatory Visit
Admission: RE | Admit: 2020-06-13 | Discharge: 2020-06-13 | Disposition: A | Discharge status: Home / Self Care | Payer: Medicare HMO | Source: Ambulatory Visit | Attending: Radiation Oncology | Admitting: Radiation Oncology

## 2020-06-13 ENCOUNTER — Encounter: Payer: Self-pay | Admitting: Radiation Oncology

## 2020-06-13 ENCOUNTER — Encounter: Payer: Self-pay | Admitting: Medical Oncology

## 2020-06-13 ENCOUNTER — Other Ambulatory Visit: Payer: Self-pay

## 2020-06-13 VITALS — BP 151/88 | HR 71 | Temp 97.1°F | Resp 18 | Ht 70.0 in | Wt 171.2 lb

## 2020-06-13 DIAGNOSIS — E119 Type 2 diabetes mellitus without complications: Secondary | ICD-10-CM | POA: Insufficient documentation

## 2020-06-13 DIAGNOSIS — Z87891 Personal history of nicotine dependence: Secondary | ICD-10-CM | POA: Insufficient documentation

## 2020-06-13 DIAGNOSIS — I129 Hypertensive chronic kidney disease with stage 1 through stage 4 chronic kidney disease, or unspecified chronic kidney disease: Secondary | ICD-10-CM | POA: Insufficient documentation

## 2020-06-13 DIAGNOSIS — K219 Gastro-esophageal reflux disease without esophagitis: Secondary | ICD-10-CM | POA: Diagnosis not present

## 2020-06-13 DIAGNOSIS — C61 Malignant neoplasm of prostate: Secondary | ICD-10-CM | POA: Diagnosis not present

## 2020-06-13 DIAGNOSIS — Z79899 Other long term (current) drug therapy: Secondary | ICD-10-CM | POA: Diagnosis not present

## 2020-06-13 DIAGNOSIS — E1122 Type 2 diabetes mellitus with diabetic chronic kidney disease: Secondary | ICD-10-CM | POA: Insufficient documentation

## 2020-06-13 DIAGNOSIS — Z86010 Personal history of colonic polyps: Secondary | ICD-10-CM | POA: Diagnosis not present

## 2020-06-13 DIAGNOSIS — Z08 Encounter for follow-up examination after completed treatment for malignant neoplasm: Secondary | ICD-10-CM | POA: Diagnosis not present

## 2020-06-13 DIAGNOSIS — E785 Hyperlipidemia, unspecified: Secondary | ICD-10-CM | POA: Insufficient documentation

## 2020-06-13 DIAGNOSIS — N189 Chronic kidney disease, unspecified: Secondary | ICD-10-CM | POA: Insufficient documentation

## 2020-06-13 DIAGNOSIS — R011 Cardiac murmur, unspecified: Secondary | ICD-10-CM | POA: Insufficient documentation

## 2020-06-13 DIAGNOSIS — Z923 Personal history of irradiation: Secondary | ICD-10-CM | POA: Insufficient documentation

## 2020-06-13 DIAGNOSIS — M129 Arthropathy, unspecified: Secondary | ICD-10-CM | POA: Diagnosis not present

## 2020-06-13 HISTORY — DX: Type 2 diabetes mellitus without complications: E11.9

## 2020-06-13 NOTE — Progress Notes (Signed)
                               Care Plan Summary  Name: Lawrence Wong DOB: 09/11/1940   Your Medical Team:   Urologist -  Dr. Raynelle Bring, [facility]  Radiation Oncologist - Dr. Tyler Pita, [facility]   Medical Oncologist - Dr. Zola Button, [employer]  Recommendations: 1) PSMA (PET) scan     * These recommendations are based on information available as of today's consult.      Recommendations may change depending on the results of further tests or exams.   Next Steps: 1) Dr. Alan Ripper office will schedule PET    When appointments need to be scheduled, you will be contacted by [facility] and/or [facility].  Questions?  Please do not hesitate to call Cira Rue, RN, BSN, OCN at [phone] any questions or concerns.  Shirlean Mylar is your Oncology Nurse Navigator and is available to assist you while you're receiving your medical care at [facility].

## 2020-06-13 NOTE — Progress Notes (Signed)
Radiation Oncology         [phone] ________________________________  Multidisciplinary Prostate Cancer Clinic  Radiation Oncology Re-Consultation  Name: Lawrence Wong MRN: [id number]  Date: 06/13/2020  DOB: 02-12-1941  [id number], Jobe Marker, MD  Franchot Gallo, MD   REFERRING PHYSICIAN: Franchot Gallo, MD  DIAGNOSIS: 80 y.o. gentleman with suspected recurrent prostate cancer with fluctuating PSA, s/p concurrent LT-ADT and 8 weeks IMRT in 2020 for Gleason 4+3 prostate cancer with pre-treatment PSA of 32.7    ICD-10-CM   1. Malignant neoplasm of prostate ([place])  C61   2. Recurrent prostate adenocarcinoma ([place])  C61     HISTORY OF PRESENT ILLNESS::Lawrence Wong is a 80 y.o. gentleman who was initially evaluated in our clinic in 01/2018. He was noted to have an elevated PSA of 28.9 in April 2019 by his primary care physician, Dr. Glendale Chard.  Per patient, he had had normal PSAs up to that point and had been seen at [facility] several years prior for a prostate exam which was normal.  Due to recent elevated PSA, he was referred back for evaluation in urology by Dr. Diona Fanti on 10/14/2017, where a digital rectal examination was performed revealing a 6 mm prostate nodule in the left base.  Repeat PSA at that time was increased to 32.70.  Therefore, the patient proceeded to transrectal ultrasound with 12 biopsies of the prostate on 10/28/2017.  The prostate volume measured 57 cc.  Out of 12 core biopsies, 4 were positive.  The maximum Gleason score was 4+3, and this was seen in the left mid lateral.  There was Gleason 3+4 seen in the left base lateral and left mid, and Gleason 3+3 seen in the left apex lateral.  Biopsies of prostate reveal:   Staging scans on 12/05/2017 include CT of the abdomen/pelvis which showed irregular enhancing contour in the posterior left aspect of the prostate gland concerning for prostate carcinoma extracapsular extension. There were also  indeterminate borderline enlarged left and right external iliac lymph nodes. No periaortic lymphadenopathy. A bone scan on 12/05/17 showed scattered degenerative type uptake at the shoulders, hips, knees, elbows, and wrists but no definite evidence of osseous metastatic disease.  The patient underwent a CT guided core needle biopsy of a high right inguinal lymph node on 01/05/2018 which revealed lymph nodal tissue with no tumor identified.  He opted to proceed with our recommendation of LT-ADT in combination with 8 weeks of external beam radiation therapy. He began ADT on 02/23/2018 and received radiation treatment from 05/09/2018 through 07/05/2018. He received his final Lupron injection (4 month) in 04/2019, and his PSA reached a nadir of 0.54 in 07/2019.  His PSA subsequently began to rise, up to 1.80 in 10/2019 and reaching 2.03 in 12/2019. He proceeded to repeat biopsy of the prostate on 03/14/2020. Out of 12 cores, 7 were positive for residual prostatic adenocarcinoma showing treatment effect. This was seen in the left base lateral (with PNI), left mid lateral, left apex latera (small focus), left base, right base, right mid (small focus), and right base lateral (small focus).  He underwent restaging CT A/P and bone scan on 05/22/2020. Both scans were negative for any visceral or bony metastatic disease. His most recent PSA from 06/06/2020 had slightly decreased to 1.18.  A PSMA scan has been ordered for further evaluation but has not yet been scheduled.  The patient reviewed the biopsy, imaging and PSA results with his urologist and he has kindly been referred today to the multidisciplinary  prostate cancer clinic for presentation of pathology and radiology studies in our conference for discussion of potential radiation treatment options and clinical evaluation.  PREVIOUS RADIATION THERAPY: Yes  05/09/2018 - 07/05/2018: (concurrent with 18 months of ADT) 1. The prostate, seminal vesicles, and pelvic lymph  nodes were initially treated to 45 Gy in 25 fractions of 1.8 Gy  2. The prostate only was boosted to 75 Gy with 15 additional fractions of 2.0 Gy   PAST MEDICAL HISTORY:  has a past medical history of Allergy, Arthritis, Asthma, Chicken pox, Chronic kidney disease, Colon polyps, Diabetes mellitus without complication ([place]), GERD (gastroesophageal reflux disease), Heart murmur, Hyperlipidemia, Hypertension, Prostate cancer ([place]), and Syphilis.    PAST SURGICAL HISTORY: Past Surgical History:  Procedure Laterality Date  . INGUINAL LYMPH NODE BIOPSY    . PROSTATE BIOPSY      FAMILY HISTORY: family history includes Heart disease in his mother.  SOCIAL HISTORY:  reports that he quit smoking about 37 years ago. His smoking use included cigarettes. He has a 40.00 pack-year smoking history. He has never used smokeless tobacco. He reports previous alcohol use. He reports that he does not use drugs.  ALLERGIES: Patient has no known allergies.  MEDICATIONS:  Current Outpatient Medications  Medication Sig Dispense Refill  . atorvastatin (LIPITOR) 10 MG tablet Take 1 tablet (10 mg total) by mouth daily. 90 tablet 3   No current facility-administered medications for this encounter.    REVIEW OF SYSTEMS:  On review of systems, the patient reports that he is doing well overall. He denies any chest pain, shortness of breath, cough, fevers, chills, night sweats, unintended weight changes. He denies any bowel disturbances, and denies abdominal pain, nausea or vomiting. He denies any new musculoskeletal or joint aches or pains. His IPSS was 1, indicating minimal urinary symptoms. He reports urinary leakage and rare incontinence. His SHIM was 6, indicating he has severe erectile dysfunction. A complete review of systems is obtained and is otherwise negative.   PHYSICAL EXAM:  Wt Readings from Last 3 Encounters:  06/13/20 171 lb 3.2 oz (77.7 kg)  01/10/20 173 lb 8 oz (78.7 kg)  07/09/19 181 lb (82.1 kg)    Temp Readings from Last 3 Encounters:  06/13/20 (!) 97.1 F (36.2 C)  01/10/20 97.9 F (36.6 C) (Temporal)  07/09/19 98.5 F (36.9 C) (Temporal)   BP Readings from Last 3 Encounters:  06/13/20 (!) 151/88  01/10/20 120/80  07/09/19 140/70   Pulse Readings from Last 3 Encounters:  06/13/20 71  01/10/20 73  07/09/19 77   Pain Assessment Pain Score: 1  Pain Frequency: Intermittent Pain Loc: Leg (intermittent cramping in legs and toes)/10  In general this is a well appearing Caucasian male in no acute distress. He's alert and oriented x4 and appropriate throughout the examination. Cardiopulmonary assessment is negative for acute distress and he exhibits normal effort.    KPS = 90  100 - Normal; no complaints; no evidence of disease. 90   - Able to carry on normal activity; minor signs or symptoms of disease. 80   - Normal activity with effort; some signs or symptoms of disease. 60   - Cares for self; unable to carry on normal activity or to do active work. 60   - Requires occasional assistance, but is able to care for most of his personal needs. 50   - Requires considerable assistance and frequent medical care. 63   - Disabled; requires special care and assistance. 30   -  Severely disabled; hospital admission is indicated although death not imminent. 57   - Very sick; hospital admission necessary; active supportive treatment necessary. 10   - Moribund; fatal processes progressing rapidly. 0     - Dead  Karnofsky DA, Abelmann [place], Craver LS and Burchenal [facility] [phone]) The use of the nitrogen mustards in the palliative treatment of carcinoma: with particular reference to bronchogenic carcinoma Cancer 1 [id number]   LABORATORY DATA:  Lab Results  Component Value Date   WBC 5.2 01/05/2018   HGB 15.6 01/05/2018   HCT 44.7 01/05/2018   MCV 95.9 01/05/2018   PLT 212 01/05/2018   Lab Results  Component Value Date   NA 139 07/09/2019   K 4.8 07/09/2019   CL 104 07/09/2019   CO2  29 07/09/2019   Lab Results  Component Value Date   ALT 30 01/10/2020   AST 27 01/10/2020   ALKPHOS 56 01/10/2020   BILITOT 0.7 01/10/2020     RADIOGRAPHY: NM Bone Scan Whole Body  Result Date: 05/22/2020 CLINICAL DATA:  Prostate cancer, rising PSA EXAM: NUCLEAR MEDICINE WHOLE BODY BONE SCAN TECHNIQUE: Whole body anterior and posterior images were obtained approximately 3 hours after intravenous injection of radiopharmaceutical. RADIOPHARMACEUTICALS:  Twenty mCi Technetium-69m MDP IV COMPARISON:  12/05/2017 FINDINGS: Anterior and posterior whole body planar images demonstrate physiologic excretion of radiotracer within the kidneys and bladder. Degenerative type activity is seen surrounding the bilateral shoulders and left knee. There is no focal radiotracer activity to suggest an acute or destructive process. No evidence of bony metastases. IMPRESSION: 1. Degenerative changes of the bilateral shoulders and left knee. No evidence of bony metastases. Electronically Signed   By: Randa Ngo M.D.   On: 05/22/2020 18:09      IMPRESSION/PLAN: 80 y.o. gentleman with suspected recurrent prostate cancer with fluctuating PSA, s/p concurrent LT-ADT and 8 weeks IMRT in 2020 for Gleason 4+3 prostate cancer with pre-treatment PSA of 32.7   We discussed the patient's workup and outlined the nature of prostate cancer in this setting. In his case, there is no indication for further radiation therapy at this point. He will undergo PSMA scan in the near future and if active disease is confirmed outside of the prostate, we would consider radiation therapy as a potential treatment option. If active disease is confirmed within the prostate, the consensus agreement was for consideration of salvage treatment with cryoablation versus continued observation. He appears to have a good understanding of his disease and our treatment recommendations.  He was encouraged to ask questions that were answered to his stated  satisfaction.  We enjoyed meeting with him again today and will continue to follow his progress via correspondence and see him back as needed, if clinically indicated in the future.     Nicholos Johns, PA-C    Tyler Pita, MD  [facility] Direct Dial: [phone]  Fax: [phone] [email or web address]  Skype  [email or web address]   This document serves as a record of services personally performed by Tyler Pita, MD and Freeman Caldron, PA-C. It was created on their behalf by Wilburn Mylar, a trained medical scribe. The creation of this record is based on the scribe's personal observations and the provider's statements to them. This document has been checked and approved by the attending provider.

## 2020-06-13 NOTE — Progress Notes (Signed)
[place] Psychosocial Distress Screening Spiritual Care  Met with Lawrence Wong in [facility] to introduce [facility] team/resources, reviewing distress screen per protocol.  The patient scored a 0 on the Psychosocial Distress Thermometer which indicates minimal distress. Also assessed for distress and other psychosocial needs.   ONCBCN DISTRESS SCREENING 06/13/2020  Screening Type Initial Screening  Distress experienced in past week (1-10) 0  Practical problem type   Physical Problem type   Physician notified of physical symptoms   Referral to clinical psychology   Referral to clinical social work   Referral to dietition   Referral to financial advocate   Referral to support programs Yes  Referral to palliative care   Other    Lawrence Wong reports minimal distress and appreciation for good information and getting acquainted with team through [facility]. No needs or concerns at this time.  Follow up needed: No. Encouraged support group/programs as interested. Ensured that he knows how to reach chaplain directly as needed/desired.   [place], [place], [facility] Pager [phone] Voicemail [phone]

## 2020-06-13 NOTE — Progress Notes (Signed)
Reason for the request:    Prostate cancer  HPI: I was asked by Dr. Diona Fanti to evaluate Lawrence Wong for evaluation of prostate cancer.  He is a 80 year old man who was found to have an elevated PSA of 32.7 in May 2019.  At that time he was evaluated and found to have a left prostate nodule and a biopsy showed a Gleason score of 4+3 equal 7 in 1 core with 3 other cores of 3+4 equal 7.  He underwent androgen deprivation therapy and radiation therapy as a definitive modality.  His initial staging work-up did not show inguinal lymph node that was biopsy-proven to be benign.  He had an excellent PSA response although his nadir was 0.54 in March 2021.  In August 2021 PSA was 2.03 and on June 06, 2020 his PSA was 1.18.  He did have a repeat biopsy completed in October 2021 which showed a treated disease although some residual adenocarcinoma remains.  Clinically, he reports no major complaints at this time.  He denies any urinary symptoms.  He denies any bone pain or pathological fractures.  Performance status status quality of life remains unchanged.  Repeat bone scan in December 2021 showed no evidence of metastatic disease.  He does not report any headaches, blurry vision, syncope or seizures. Does not report any fevers, chills or sweats.  Does not report any cough, wheezing or hemoptysis.  Does not report any chest pain, palpitation, orthopnea or leg edema.  Does not report any nausea, vomiting or abdominal pain.  Does not report any constipation or diarrhea.  Does not report any skeletal complaints.    Does not report frequency, urgency or hematuria.  Does not report any skin rashes or lesions. Does not report any heat or cold intolerance.  Does not report any lymphadenopathy or petechiae.  Does not report any anxiety or depression.  Remaining review of systems is negative.    Past Medical History:  Diagnosis Date  . Allergy   . Arthritis   . Asthma   . Chicken pox   . Chronic kidney disease   .  Colon polyps   . Diabetes mellitus without complication ([place])   . GERD (gastroesophageal reflux disease)   . Heart murmur   . Hyperlipidemia   . Hypertension   . Prostate cancer ([place])   . Syphilis   :  Past Surgical History:  Procedure Laterality Date  . INGUINAL LYMPH NODE BIOPSY    . PROSTATE BIOPSY    :   Current Outpatient Medications:  .  atorvastatin (LIPITOR) 10 MG tablet, Take 1 tablet (10 mg total) by mouth daily., Disp: 90 tablet, Rfl: 3:  No Known Allergies:  Family History  Problem Relation Age of Onset  . Heart disease Mother   . Cancer Neg Hx   . Breast cancer Neg Hx   . Prostate cancer Neg Hx   . Colon cancer Neg Hx   . Pancreatic cancer Neg Hx   :  Social History   Socioeconomic History  . Marital status: Single    Spouse name: Not on file  . Number of children: 1  . Years of education: Not on file  . Highest education level: Not on file  Occupational History  . Occupation: retired  Tobacco Use  . Smoking status: Former Smoker    Packs/day: 1.00    Years: 40.00    Pack years: 40.00    Types: Cigarettes    Quit date: 06/01/1983    Years  since quitting: 37.0  . Smokeless tobacco: Never Used  . Tobacco comment: States, "I have been around cigarettes my entire lift. 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 Vaping Use: Never used  Substance and Sexual Activity  . Alcohol use: Not Currently    Comment: quit years ago  . Drug use: Never  . Sexual activity: Not Currently  Other Topics Concern  . 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 Difficulty of Paying Living Expenses: Not hard at all  Food Insecurity: No Food Insecurity  . Worried About Charity fundraiser in the Last Year: Never true  . Ran Out of Food in the Last Year: Never true  Transportation Needs: No Transportation Needs  . Lack of Transportation (Medical): No  . Lack of Transportation (Non-Medical): No  Physical Activity: Inactive  . Days of Exercise per Week: 0 days  . Minutes of Exercise per Session: 0 min  Stress: No Stress Concern Present  . Feeling of Stress : Not at all  Social Connections: Not on file  Intimate Partner Violence: Not At Risk  . Fear of Current or Ex-Partner: No  . Emotionally Abused: No  . Physically Abused: No  . Sexually Abused: No  :  Pertinent items are noted in HPI.  Exam: ECOG 1 General appearance: alert and cooperative appeared without distress. Head: atraumatic without any abnormalities. Eyes: conjunctivae/corneas clear. PERRL.  Sclera anicteric. Throat: lips, mucosa, and tongue normal; without oral thrush or ulcers. Resp: clear to auscultation bilaterally without rhonchi, wheezes or dullness to percussion. Cardio: regular rate and rhythm, S1, S2 normal, no murmur, click, rub or gallop GI: soft, non-tender; bowel sounds normal; no masses,  no organomegaly Skin: Skin color, texture, turgor normal. No rashes or lesions Lymph nodes: Cervical, supraclavicular, and axillary nodes normal. Neurologic: Grossly normal without any motor, sensory or deep tendon reflexes. Musculoskeletal: No joint deformity or effusion.    NM Bone Scan Whole Body  Result Date: 05/22/2020 CLINICAL DATA:  Prostate cancer, rising PSA EXAM: NUCLEAR MEDICINE WHOLE BODY BONE SCAN TECHNIQUE: Whole body anterior and posterior images were obtained approximately 3 hours  after intravenous injection of radiopharmaceutical. RADIOPHARMACEUTICALS:  Twenty mCi Technetium-22m MDP IV COMPARISON:  12/05/2017 FINDINGS: Anterior and posterior whole body planar images demonstrate physiologic excretion of radiotracer within the kidneys and bladder. Degenerative type activity is seen surrounding the bilateral shoulders and left knee. There is no focal radiotracer activity to suggest an acute or destructive process. No evidence of bony metastases. IMPRESSION: 1. Degenerative changes of the bilateral shoulders and left knee. No evidence of bony metastases. Electronically Signed   By: Randa Ngo M.D.   On: 05/22/2020 18:09  Assessment and Plan:   80 year old with prostate cancer diagnosed in 2019.  He was found to have a PSA of 32.7 and a Gleason score 4+3 equal 7 in 4 cores.  He received definitive therapy with androgen deprivation for close to 2 years in addition to external beam radiation completed and February 2020.  He has an increase in his PSA up to 2.03 in August 2021.  His PSA in January 2020 June down to 1.18.  His disease status was discussed today in the prostate cancer multidisciplinary clinic including review of his pathology results with the reviewing pathologist as well as reviewed imaging studies with radiology.  Treatment options also reviewed including systemic therapy versus local therapy versus continued observation and surveillance.  At this time, the recommendation to obtain a PSMA scan to determine the status of his systemic disease.  If he has no evidence of metastatic disease then a period of observation would be reasonable and follow-up biopsy in the future to determine the extent of his local disease.  Local therapy could be considered in the form of cryotherapy versus additional options.  If he has oligometastatic disease, targeted therapy to these areas could be considered with radiation.  All his questions were answered at this time.  Given his age  and overall life expectancy, short period of observation.  His PSA in the future would be reasonable after a PSMA scan staging.  45  minutes were dedicated to this visit. The time was spent on reviewing laboratory data, imaging studies, discussing treatment options, diagnosis and answering questions regarding future plan.     A copy of this consult has been forwarded to the requesting physician.

## 2020-06-13 NOTE — Consult Note (Signed)
[facility]     06/13/2020   --------------------------------------------------------------------------------   [name]  MRN: [id number]  DOB: 03-May-1941, 80 year old Male  SSN: [id number]   PRIMARY CARE:  Waunita Schooner, MD  REFERRING:  Glendale Chard, MD  PROVIDER:  Franchot Gallo, M.D.  TREATING:  Raynelle Bring, M.D.  LOCATION:  [facility] [phone]     --------------------------------------------------------------------------------   CC/HPI: CC: Prostate Cancer   Physician requesting consult: Dr. Franchot Gallo  PCP: Dr. Waunita Schooner  Location of consult: [facility]   Mr. Minette Brine is a 80 year old gentleman with a medical history of hyperlipidemia, hypertension, GERD, and glaucoma. He was initially diagnosed with prostate cancer in May of 2019 when he presented to Dr. Diona Fanti with a PSA of 32.7 and firm left sided prostate lesion on exam. A TRUS biopsy was performed on 10/28/17 and demonstrated 4 out of 12 biopsy cores positive for Gleason 4+3=7 adenocarcinoma. He did undergo staging imaging in July 2019 with a CT of the abdomen and pelvis that demonstrated concern for extra prostatic extension on the left and some indeterminate borderline external iliac lymph nodes but no other concern for metastatic disease. A bone scan at that time was negative for metastatic disease. He did undergo a percutaneous CT-guided biopsy of a right inguinal lymph nodes that measured 15 mm that was negative for metastatic disease. He was treated with long term androgen deprivation from September 2019 until March 2021 for a total of 18 months and underwent definitive EBRT by Dr. Tammi Klippel completed in February 2020.   He was noted to have a PSA nadir of 0.54 in March of 2021 at the completion of his ADT. His PSA subsequently increased steadily off ADT to 1.8 in June 2021 and then to 2.03 in August 2021. This  prompted Dr. Diona Fanti to proceed with repeat prostate biopsy on 03/14/20 that indicated adenocarcinoma with treatment effect in 7 out of 12 biopsy cores. Repeat staging imaging was then performed including a CT of the abdomen and pelvis on 05/22/20 that was negative for metastatic disease and a bone scan from 05/22/20 that also indicated no evidence of metastatic disease. He presents today for further discussion of options. He did have a PSA performed on 06/06/2020. Interestingly, this decreased to 1.18.    PMH: He has a history of hyperlipidemia, hypertension, glaucoma, and GERD.  PSH: No abdominal surgeries.   Urinary function: IPSS is 1.  Erectile function: SHIM score is 6.     ALLERGIES: No Allergies    MEDICATIONS: Aspirin Ec 81 mg tablet, delayed release  Atorvastatin Calcium  Levofloxacin 750 mg tablet 1 tablet PO As Directed Take Morning of the procedure when you wake up     GU PSH: Locm 300-399Mg /Ml Iodine,1Ml - 05/22/2020, 2019 PLACE RT DEVICE/MARKER, PROS - 04/20/2018 Prostate Needle Biopsy - 03/14/2020, 2019 Transperineal Plmt Biodegradable Matrl 1/Mlt Njx - 04/20/2018       PSH Notes: Upper Gastrointestinal Endoscopy (Therapeutic)   NON-GU PSH: Surgical Pathology, Gross And Microscopic Examination For Prostate Needle - 03/14/2020, 2019     GU PMH: Prostate Cancer - 05/22/2020, - 03/14/2020, Steady PSA increase., - 01/16/2020, PSA increased rather quickly having him come off ADT. Testosterone also rebounded rather quickly. Will not resume ADT today, but will continue to monitor PSA over next few months. Will repeat imaging if PSA continues to increase and consider resuming ADT. , - 11/14/2019, PSA at its lowest level  though I would like to see this lower at this point. His T level is castrate -- I think it would be appropriate to skip Lupron at this visit. , - 08/10/2019 (Stable), PSA and T responding well to ADT. He is tolerating these injections well. Will continue to  monitor. , - 04/11/2019 (Stable), Doing well onn LT ADT following EBRT with acceptable PSA curve, - 12/06/2018, He completed external beam radiotherapy about a month ago which he tolerated well., - 08/02/2018, - 04/20/2018 (Stable), High-risk prostate cancer, he has started long-term androgen deprivation therapy, here for his 1st Lupron today. He will need placement of fiducial markers and Space OAR in the near future., - 03/29/2018, Adenocarcinoma prostate, intermediate/high risk based on the pathology of GS 4+3 and PSA of 29. Negative biopsy of inguinal lymph node, - 02/08/2018, Pathologic intermediate risk prostate cancer, 4/12 cores positive on recent biopsy. PSA high at 32.7. This alone puts them in high risk stratification, in addition to him having borderline external iliac adenopathy., - 12/26/2017 Rising PSA after prostate cancer treatment - 01/16/2020 Elevated PSA - 2019, Elevated PSA with moderately suspicious prostate nodule. I don't have any old PSA data on him., - 2019 Prostate nodule w/o LUTS - 2019 Encounter for Prostate Cancer screening, Prostate cancer screening - 2014      PMH Notes:  1898-05-31 00:00:00 - Note: Normal Routine History And Physical Senior Citizen (65-80)\  Hx of Heart Murmur. Hx. of stomach ulcers   NON-GU PMH: Other long term (current) drug therapy - 03/29/2018 Gastric ulcer, unspecified as acute or chronic, without hemorrhage or perforation, Gastric Ulcer - 2014 Personal history of other diseases of the circulatory system, History of hypertension - 2014 Personal history of other diseases of the digestive system, History of esophageal reflux - 2014 Personal history of other endocrine, nutritional and metabolic disease, History of hypercholesterolemia - 2014 Arthritis Glaucoma Hypertension    FAMILY HISTORY: Death In The Family Father - Father Death In The Family Mother - Mother Family Health Status Number - Runs In Family   SOCIAL HISTORY: Marital Status:  Single Preferred Language: English; Ethnicity: Not Hispanic Or Latino; Race: Black or African American Current Smoking Status: Patient does not smoke anymore. Smoked for 30 years.   Tobacco Use Assessment Completed: Used Tobacco in last 30 days? Social Drinker.  Drinks 3 caffeinated drinks per day. Patient's occupation is/was retired.    VITAL SIGNS: None   MULTI-SYSTEM PHYSICAL EXAMINATION:    Constitutional: Well-nourished. No physical deformities. Normally developed. Good grooming.     Complexity of Data:  Lab Test Review:   PSA  Records Review:   Pathology Reports, Previous Patient Records  X-Ray Review: C.T. Abdomen/Pelvis: Reviewed Films.  Bone Scan: Reviewed Films.     01/08/20 11/09/19 08/03/19 04/04/19 12/06/18 08/02/18 10/14/17 09/15/17  PSA  Total PSA 2.03 ng/mL 1.80 ng/mL 0.54 ng/mL 0.68 ng/mL 1.00 ng/mL 1.65 ng/mL 32.70 ng/mL 28.9 ng/dl    11/09/19 08/03/19 04/04/19 12/06/18 08/02/18  Hormones  Testosterone, Total 208.2 ng/dL 17.7 ng/dL 19.8 ng/dL 15.5 ng/dL 20.3 ng/dL   Notes:                     CLINICAL DATA: Prostate cancer, rising PSA   EXAM:  NUCLEAR MEDICINE WHOLE BODY BONE SCAN   TECHNIQUE:  Whole body anterior and posterior images were obtained approximately  3 hours after intravenous injection of radiopharmaceutical.   RADIOPHARMACEUTICALS: Twenty mCi Technetium-48m MDP IV   COMPARISON: 12/05/2017   FINDINGS:  Anterior  and posterior whole body planar images demonstrate  physiologic excretion of radiotracer within the kidneys and bladder.  Degenerative type activity is seen surrounding the bilateral  shoulders and left knee.   There is no focal radiotracer activity to suggest an acute or  destructive process. No evidence of bony metastases.   IMPRESSION:  1. Degenerative changes of the bilateral shoulders and left knee. No  evidence of bony metastases.    Electronically Signed  By: Randa Ngo M.D.  On: 05/22/2020 18:09   CLINICAL  DATA: Prostate cancer.   EXAM:  CT ABDOMEN AND PELVIS WITH CONTRAST   TECHNIQUE:  Multidetector CT imaging of the abdomen and pelvis was performed  using the standard protocol following bolus administration of  intravenous contrast.   CONTRAST: 100 cc Omnipaque 300   COMPARISON: 12/05/2017   FINDINGS:  Lower chest: Unremarkable.   Hepatobiliary: Tiny hypodensities in the right liver were present  previously but are more conspicuous today, likely due to the  presence of intravenous contrast material. There is no evidence for  gallstones, gallbladder wall thickening, or pericholecystic fluid.  No intrahepatic or extrahepatic biliary dilation.   Pancreas: No focal mass lesion. No dilatation of the main duct. No  intraparenchymal cyst. No peripancreatic edema.   Spleen: 9 mm calcified saccular aneurysm of the distal splenic  artery noted. No splenomegaly. No focal mass lesion.   Adrenals/Urinary Tract: No adrenal nodule or mass. 9 mm  nonobstructing stone upper pole right kidney. No left renal stones.  Cortical scarring evident in the kidneys bilaterally hypoattenuating  lesions in both kidneys are similar to prior, likely cyst. No  evidence for hydroureter. The urinary bladder appears normal for the  degree of distention.   Stomach/Bowel: Tiny hiatal hernia. Stomach is unremarkable. No  gastric wall thickening. No evidence of outlet obstruction. No small  bowel wall thickening. No small bowel dilatation. The terminal ileum  is normal. The appendix is not visualized, but there is no edema or  inflammation in the region of the cecum. Diverticular changes are  noted in the left colon without evidence of diverticulitis.   Vascular/Lymphatic: There is abdominal aortic atherosclerosis  without aneurysm. There is no gastrohepatic or hepatoduodenal  ligament lymphadenopathy. No retroperitoneal or mesenteric  lymphadenopathy. No pelvic sidewall lymphadenopathy.   Reproductive:  Prostate gland appears smaller in size than it did on  the prior study. Fiducial markers evident.   Other: No intraperitoneal free fluid.   Musculoskeletal: Small lucent lesion in the anterior left iliac  crest with sclerotic rim is unchanged in the interval, likely  benign. No worrisome lytic or sclerotic osseous abnormality.   IMPRESSION:  1. No evidence for metastatic disease in the abdomen or pelvis.  2. 9 mm nonobstructing stone upper pole right kidney.  3. Tiny hiatal hernia.  4. Aortic Atherosclerosis (ICD10-I70.0).    Electronically Signed  By: Misty Stanley M.D.  On: 05/22/2020 08:56   PROCEDURES: None   ASSESSMENT:      ICD-10 Details  1 GU:   Prostate Cancer - C61    PLAN:           Document Letter(s):  Created for Patient: Clinical Summary         Notes:   Prostate cancer: I had a detailed discussion with Mr. Kohlmann today. He has seen both Dr. Alen Blew and Dr. Tammi Klippel earlier this morning. We reviewed his situation in detail. He understands that he does have at least locally recurrent prostate cancer and this is likely  real considering the fact that his biopsy was performed more than 18 months after he completed definitive radiation therapy. We also discussed the fact that his PSA has significantly decreased from the summer indicating only slightly progressive disease at this time. Although he has no evidence of metastatic disease on conventional imaging, it was the consensus recommendation that he proceed with a PSMA PET scan for more definitive evaluation to assess his risk of harboring systemic disease. If he does have locally recurrent disease only, it was discussed that he could either proceed with local salvage therapy such as cryoablation or even close observation considering the slower pace of progression as evidenced by his recent PSA result. If he did have evidence of metastatic disease, obviously he would be a candidate to begin systemic therapy. Dr. Diona Fanti will  be notified of these recommendations.   Cc: Dr. Waunita Schooner  Dr. Tyler Pita  Dr. Zola Button  Dr. Franchot Gallo

## 2020-06-25 ENCOUNTER — Other Ambulatory Visit: Payer: Self-pay | Admitting: Family Medicine

## 2020-06-25 NOTE — Telephone Encounter (Signed)
Pharmacy requests refill on: Atorvastatin 10 mg   LAST REFILL: 07/10/2019 (Q-90, R-3) LAST OV: 01/10/2020 NEXT OV: 01/12/2021 PHARMACY: [id number] [place], [place]

## 2020-07-02 ENCOUNTER — Other Ambulatory Visit: Payer: Self-pay

## 2020-07-02 ENCOUNTER — Ambulatory Visit (HOSPITAL_COMMUNITY)
Admission: RE | Admit: 2020-07-02 | Discharge: 2020-07-02 | Disposition: A | Discharge status: Home / Self Care | Payer: Medicare HMO | Source: Ambulatory Visit | Attending: Urology | Admitting: Urology

## 2020-07-02 DIAGNOSIS — R9721 Rising PSA following treatment for malignant neoplasm of prostate: Secondary | ICD-10-CM | POA: Insufficient documentation

## 2020-07-02 DIAGNOSIS — C61 Malignant neoplasm of prostate: Secondary | ICD-10-CM | POA: Insufficient documentation

## 2020-07-02 MED ORDER — PIFLIFOLASTAT F 18 (PYLARIFY) INJECTION
9.0000 | Freq: Once | INTRAVENOUS | Status: AC
  Administered 2020-07-02: 9 via INTRAVENOUS

## 2020-07-14 DIAGNOSIS — C61 Malignant neoplasm of prostate: Secondary | ICD-10-CM | POA: Diagnosis not present

## 2020-09-08 DIAGNOSIS — C61 Malignant neoplasm of prostate: Secondary | ICD-10-CM | POA: Diagnosis not present

## 2020-09-15 DIAGNOSIS — R9721 Rising PSA following treatment for malignant neoplasm of prostate: Secondary | ICD-10-CM | POA: Diagnosis not present

## 2020-09-15 DIAGNOSIS — C61 Malignant neoplasm of prostate: Secondary | ICD-10-CM | POA: Diagnosis not present

## 2020-10-09 DIAGNOSIS — Z7185 Encounter for immunization safety counseling: Secondary | ICD-10-CM | POA: Diagnosis not present

## 2020-10-09 DIAGNOSIS — Z23 Encounter for immunization: Secondary | ICD-10-CM | POA: Diagnosis not present

## 2020-12-17 ENCOUNTER — Other Ambulatory Visit: Payer: Self-pay | Admitting: Family Medicine

## 2021-01-06 DIAGNOSIS — C61 Malignant neoplasm of prostate: Secondary | ICD-10-CM | POA: Diagnosis not present

## 2021-01-08 ENCOUNTER — Ambulatory Visit: Payer: Medicare HMO

## 2021-01-12 ENCOUNTER — Ambulatory Visit (INDEPENDENT_AMBULATORY_CARE_PROVIDER_SITE_OTHER): Payer: Medicare HMO | Admitting: Family Medicine

## 2021-01-12 ENCOUNTER — Other Ambulatory Visit: Payer: Self-pay

## 2021-01-12 VITALS — BP 132/70 | HR 70 | Temp 98.0°F | Ht 68.5 in | Wt 161.0 lb

## 2021-01-12 DIAGNOSIS — C61 Malignant neoplasm of prostate: Secondary | ICD-10-CM | POA: Diagnosis not present

## 2021-01-12 DIAGNOSIS — M65349 Trigger finger, unspecified ring finger: Secondary | ICD-10-CM | POA: Diagnosis not present

## 2021-01-12 DIAGNOSIS — Z0100 Encounter for examination of eyes and vision without abnormal findings: Secondary | ICD-10-CM

## 2021-01-12 DIAGNOSIS — E782 Mixed hyperlipidemia: Secondary | ICD-10-CM

## 2021-01-12 DIAGNOSIS — R7303 Prediabetes: Secondary | ICD-10-CM

## 2021-01-12 DIAGNOSIS — R9721 Rising PSA following treatment for malignant neoplasm of prostate: Secondary | ICD-10-CM | POA: Diagnosis not present

## 2021-01-12 DIAGNOSIS — Z0000 Encounter for general adult medical examination without abnormal findings: Secondary | ICD-10-CM | POA: Diagnosis not present

## 2021-01-12 LAB — COMPREHENSIVE METABOLIC PANEL
ALT: 24 U/L (ref 0–53)
AST: 23 U/L (ref 0–37)
Albumin: 4 g/dL (ref 3.5–5.2)
Alkaline Phosphatase: 47 U/L (ref 39–117)
BUN: 12 mg/dL (ref 6–23)
CO2: 30 mEq/L (ref 19–32)
Calcium: 9.9 mg/dL (ref 8.4–10.5)
Chloride: 105 mEq/L (ref 96–112)
Creatinine, Ser: 0.99 mg/dL (ref 0.40–1.50)
GFR: 72.55 mL/min (ref >60.00)
Glucose, Bld: 71 mg/dL (ref 70–99)
Potassium: 4.5 mEq/L (ref 3.5–5.1)
Sodium: 140 mEq/L (ref 135–145)
Total Bilirubin: 0.6 mg/dL (ref 0.2–1.2)
Total Protein: 6.8 g/dL (ref 6.0–8.3)

## 2021-01-12 LAB — LIPID PANEL
Cholesterol: 117 mg/dL (ref 0–200)
HDL: 30.6 mg/dL — ABNORMAL LOW (ref >39.00)
LDL Cholesterol: 67 mg/dL (ref 0–99)
NonHDL: 86.89
Total CHOL/HDL Ratio: 4
Triglycerides: 100 mg/dL (ref 0.0–149.0)
VLDL: 20 mg/dL (ref 0.0–40.0)

## 2021-01-12 LAB — HEMOGLOBIN A1C: Hgb A1c MFr Bld: 5.8 % (ref 4.6–6.5)

## 2021-01-12 MED ORDER — ATORVASTATIN CALCIUM 10 MG PO TABS: 10.0000 mg | ORAL_TABLET | Freq: Every day | ORAL | 3 refills | Status: DC

## 2021-01-12 NOTE — Progress Notes (Signed)
Annual Exam   Chief Complaint: No chief complaint on file.   History of Present Illness:  Lawrence Wong is a 80 y.o. presents today for annual examination.    Trigger finger - for 2+ years  Nutrition/Lifestyle Diet: pretty good Exercise: working, mowing, yard work He is not sexually active.  Any issues with getting or keeping erection? Yes  Social History   Tobacco Use  Smoking Status Former   Packs/day: 1.00   Years: 40.00   Pack years: 40.00   Types: Cigarettes   Quit date: 06/01/1983   Years since quitting: 37.6  Smokeless Tobacco Never  Tobacco Comments   States, "I have been around cigarettes my entire lift. 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     The patient feels safe at home and in their relationships: yes.  General Health Dentist in the last year: Yes Eye doctor: no  Weight Wt Readings from Last 3 Encounters:  01/12/21 161 lb (73 kg)  06/13/20 171 lb 3.2 oz (77.7 kg)  01/10/20 173 lb 8 oz (78.7 kg)   Patient has normal BMI  BMI Readings from Last 1 Encounters:  01/12/21 24.12 kg/m     Chronic disease screening Blood pressure monitoring:  BP Readings from Last 3 Encounters:  01/12/21 132/70  06/13/20 (!) 151/88  01/10/20 120/80    Lipid Monitoring: Indication for screening: age >35, obesity, diabetes, family hx, CV risk factors.  Lipid screening: Yes  Lab Results  Component Value Date   CHOL 124 01/10/2020   HDL 30.50 (L) 01/10/2020   LDLCALC 69 01/10/2020   TRIG 120.0 01/10/2020   CHOLHDL 4 01/10/2020     Diabetes Screening: age >36, overweight, family hx, PCOS, hx of gestational diabetes, at risk ethnicity, elevated blood pressure >135/80.  Diabetes Screening screening: Yes  Lab Results   Component Value Date   HGBA1C 6.2 01/10/2020     Prostate Cancer Screening: Yes Age 5-69 yo Shared Decision Making Higher Risk: Older age, African American, Family Hx of Prostate Cancer - Yes Benefits: screening may prevent 1.3 deaths from prostate cancer over 13 years per 1000 men screened and prevent 3 metastatic cases per 1000 men screened. Not enough evidence to support more benefit for AA or Lawrence Wong: False Positive and psychological Wong. 15% of me with false positive over a 2 to 4 year period > resulting in biopsy and complications such as pain, hematospermia, infections. Overdiagnosis - increases with age - found that 20-50% of prostate cancer through screening may have never caused any issues. Wong of treatment include - erectile dysfunction, urinary incontinence, and bothersome bowel symptoms.   After discussion he does not want to get a PSA checked today.   Inadequate evidence for screening <55 No mortality benefit for screening >70   Lab Results  Component Value Date   PSA 28.9 09/15/2017   Has cancer and is in treatment and screening with urology    Colon Cancer Screening:  Age 39-75 yo - benefits outweigh the risk. Adults 58-85 yo who have never been screened benefit.  Benefits: 134000 people in 2016 will be diagnosed and 49,000 will die - early detection helps Wong: Complications 2/2 to colonoscopy High Risk (Colonoscopy): genetic disorder Lawrence Wong  syndrome or familial adenomatous polyposis), personal hx of IBD, previous adenomatous polyp, or previous colorectal cancer, FamHx start 10 years before the age at diagnosis, increased in males and black race  Options:  FIT - looks for hemoglobin (blood in the stool) - specific and fairly sensitive - must be done annually Cologuard - looks for DNA and blood - more sensitive - therefore can have more false positives, every 3 years Colonoscopy - every 10 years if normal - sedation, bowl prep, must have someone drive  you  Shared decision making and the patient had decided to do no longer indicated.   Social History   Tobacco Use  Smoking Status Former   Packs/day: 1.00   Years: 40.00   Pack years: 40.00   Types: Cigarettes   Quit date: 06/01/1983   Years since quitting: 37.6  Smokeless Tobacco Never  Tobacco Comments   States, "I have been around cigarettes my entire lift. 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 Yes Current Tobacco user? No Quit less than 15 years ago? No Interested in low dose CT for lung cancer screening? not applicable  Abdominal Aortic Aneurysm:  Age 52-75, 1 time screening, men who have ever smoked done    Past Medical History:  Diagnosis Date   Allergy    Arthritis    Asthma    Chicken pox    Chronic kidney disease    Colon polyps    Diabetes mellitus without complication (HCC)    GERD (gastroesophageal reflux disease)    Heart murmur    Hyperlipidemia    Hypertension    Prostate cancer ([place])    Syphilis     Past Surgical History:  Procedure Laterality Date   INGUINAL LYMPH NODE BIOPSY     PROSTATE BIOPSY      Prior to Admission medications   Medication Sig Start Date End Date Taking? Authorizing Provider  atorvastatin (LIPITOR) 10 MG tablet TAKE 1 TABLET BY MOUTH EVERY DAY 12/18/20  Yes [name], MD    No Known Allergies   Social History   Socioeconomic History   Marital status: Single    Spouse name: Not on file   Number of children: 1   Years of education: Not on file   Highest education level: Not on file  Occupational History   Occupation: retired  Tobacco Use   Smoking status: Former    Packs/day: 1.00    Years: 40.00    Pack years: 40.00    Types: Cigarettes    Quit date: 06/01/1983    Years since quitting: 37.6   Smokeless tobacco: Never   Tobacco comments:    States, "I have been around cigarettes my entire lift. 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      Family: one son - [place] - in [place], does not see him often;  step children in [place]      Enjoys: works and keeps busy on the [employer] - mowing      Exercise: working and keeping busy   Diet: eats pretty good      Land belts: yes   Guns: Yes  and secure   Safe in relationships: Yes    Social Determinants of Radio broadcast assistant Strain: Not on file  Food Insecurity: Not on file  Transportation Needs: Not on file  Physical Activity: Not on file  Stress: Not on file  Social Connections: Not on file  Intimate Partner Violence: Not on file    Family History  Problem Relation Age of Onset   Heart disease Mother    Cancer Neg Hx    Breast cancer Neg Hx    Prostate cancer Neg Hx    Colon cancer Neg Hx    Pancreatic cancer Neg Hx     Review of Systems  Constitutional:  Negative for chills and fever.  HENT:  Negative for congestion and sore throat.   Eyes:  Negative for blurred vision and double vision.  Respiratory:  Negative for shortness of breath.   Cardiovascular:  Negative for chest pain.  Gastrointestinal:  Negative for heartburn, nausea and vomiting.  Genitourinary: Negative.   Musculoskeletal: Negative.  Negative for myalgias.  Skin:  Negative for rash.  Neurological:  Negative for dizziness and headaches.  Endo/Heme/Allergies:  Does not bruise/bleed easily.  Psychiatric/Behavioral:  Negative for depression. The patient is not nervous/anxious.     Physical Exam BP 132/70   Pulse  70   Temp 98 F (36.7 C) (Temporal)   Ht 5' 8.5" (1.74 m)   Wt 161 lb (73 kg)   SpO2 98%   BMI 24.12 kg/m    BP Readings from Last 3 Encounters:  01/12/21 132/70  06/13/20 (!) 151/88  01/10/20 120/80      Physical Exam Constitutional:      General: He is not in acute distress.    Appearance: He is well-developed. He is not diaphoretic.  HENT:     Head: Normocephalic and atraumatic.     Right Ear: Tympanic membrane and ear canal normal.     Left Ear: Tympanic membrane and ear canal normal.     Nose: Nose normal.     Mouth/Throat:     Pharynx: Uvula midline.  Eyes:     General: No scleral icterus.    Conjunctiva/sclera: Conjunctivae normal.     Pupils: Pupils are equal, round, and reactive to light.  Cardiovascular:     Rate and Rhythm: Normal rate and regular rhythm.     Heart sounds: Normal heart sounds. No murmur heard. Pulmonary:     Effort: Pulmonary effort is normal. No respiratory distress.     Breath sounds: Normal breath sounds. No wheezing.  Abdominal:     General: Bowel sounds are normal. There is no distension.     Palpations: Abdomen is soft. There is no mass.     Tenderness: There is no abdominal tenderness. There is no guarding.  Musculoskeletal:        General: Normal range of motion.     Cervical back: Normal range of motion and neck supple.     Comments: Trigger finger on ring finger b/l  Lymphadenopathy:     Cervical: No cervical adenopathy.  Skin:    General: Skin is warm and dry.     Capillary Refill: Capillary refill takes less than  2 seconds.  Neurological:     Mental Status: He is alert and oriented to person, place, and time.       Results:  PHQ-9:  Flowsheet Row Clinical Support from 01/02/2020 in [place] [facility] [place]  PHQ-9 Total Score 0         Assessment: 80 y.o. here for routine annual physical examination.  Plan: Problem List Items Addressed This Visit       Other   Hyperlipidemia   Relevant  Medications   atorvastatin (LIPITOR) 10 MG tablet   Other Relevant Orders   Comprehensive metabolic panel   Lipid panel   Other Visit Diagnoses     Annual physical exam    -  Primary   Trigger ring finger, unspecified laterality       Relevant Orders   Ambulatory referral to Hand Surgery   Routine eye exam       Relevant Orders   Ambulatory referral to Ophthalmology   Prediabetes       Relevant Orders   Hemoglobin A1c       Screening: -- Blood pressure screen normal -- cholesterol screening: will obtain -- Weight screening: normal -- Diabetes Screening: will obtain -- Nutrition: normal - Encouraged healthy diet  The ASCVD Risk score Mikey Bussing DC Jr., et al., 2013) failed to calculate for the following reasons:   The valid total cholesterol range is 130 to 320 mg/dL  -- ASA 81 mg discussed if CVD risk >10% age 17-59 and willing to take for 10 years -- Statin therapy for Age 73-75 with CVD risk >7.5%  Psych -- Depression screening (PHQ-9): negative  Safety -- tobacco screening: not using -- alcohol screening:  low-risk usage. -- no evidence of domestic violence or intimate partner violence.  Cancer Screening -- Prostate (age 72-69)  pt sees Urology -- Colon (age 56-75) not indicated -- Lung not indicated   Immunizations Immunization History  Administered Date(s) Administered   Influenza, High Dose Seasonal PF 03/29/2018   Influenza-Unspecified 03/01/2019   PFIZER(Purple Top)SARS-COV-2 Vaccination 07/27/2019, 08/22/2019, 04/30/2020, 10/09/2020   Pneumococcal Conjugate-13 01/08/2016   Pneumococcal Polysaccharide-23 12/27/2011   Tdap 12/27/2011   Zoster Recombinat (Shingrix) 03/28/2017, 02/07/2020    -- flu vaccine up to date -- TDAP q10 years up to date -- Shingles (age >37) not up to date - needs 2nd dose -- PPSV-23 (19-64 with chronic disease or smoking) up to date -- PCV-13 (age >40) - one dose followed by PPSV-23 1 year later up to date -- Covid-19 Vaccine  up to date  Encouraged regular vision and dental screening. Encouraged healthy exercise and diet.   [name]

## 2021-01-12 NOTE — Patient Instructions (Addendum)
Get your flu shot this year when available.   Hand referral and eye exam referral   Consider starting Aspirin 81 mg daily - monitor for side effects  #Referral I have placed a referral to a specialist for you. You should receive a phone call from the specialty office. Make sure your voicemail is not full and that if you are able to answer your phone to unknown or new numbers.   It may take up to 2 weeks to hear about the referral. If you do not hear anything in 2 weeks, please call our office and ask to speak with the referral coordinator.

## 2021-01-27 DIAGNOSIS — M720 Palmar fascial fibromatosis [Dupuytren]: Secondary | ICD-10-CM | POA: Diagnosis not present

## 2021-02-10 DIAGNOSIS — M720 Palmar fascial fibromatosis [Dupuytren]: Secondary | ICD-10-CM | POA: Diagnosis not present

## 2021-02-20 IMAGING — PT NM PET TUM IMG SKULL BASE T - THIGH
1 series · 3 of 3 positions shown · non-contrast
Comparison: Whole-body bone scan 05/22/2020,  CT 05/22/2020

CLINICAL DATA: Prostate carcinoma with biochemical recurrence.
Rising PSA equal 2.03.

EXAM:
NUCLEAR MEDICINE PET SKULL BASE TO THIGH
TECHNIQUE: 9.6 mCi F18 Piflufolastat (Pylarify) was injected intravenously.
Full-ring PET imaging was performed from the skull base to thigh
after the radiotracer. CT data was obtained and used for attenuation
correction and anatomic localization.

[Series 1088: results mm oncology reading · 1.0mm · 0.89mm/px · 3 of 3 slices shown]
[im 1/3]
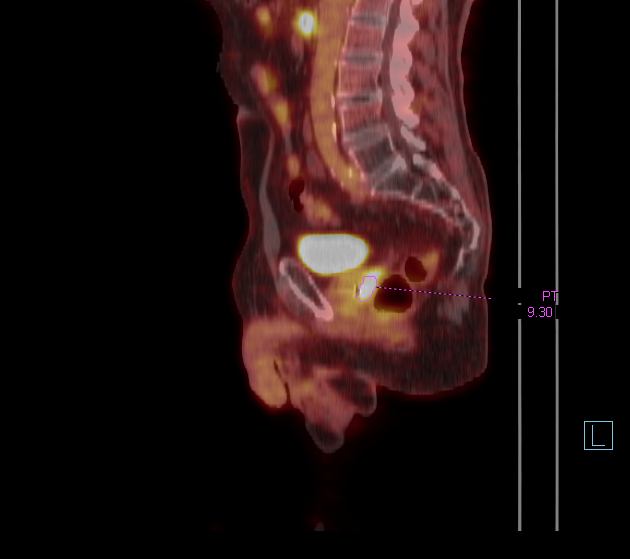
[im 2/3]
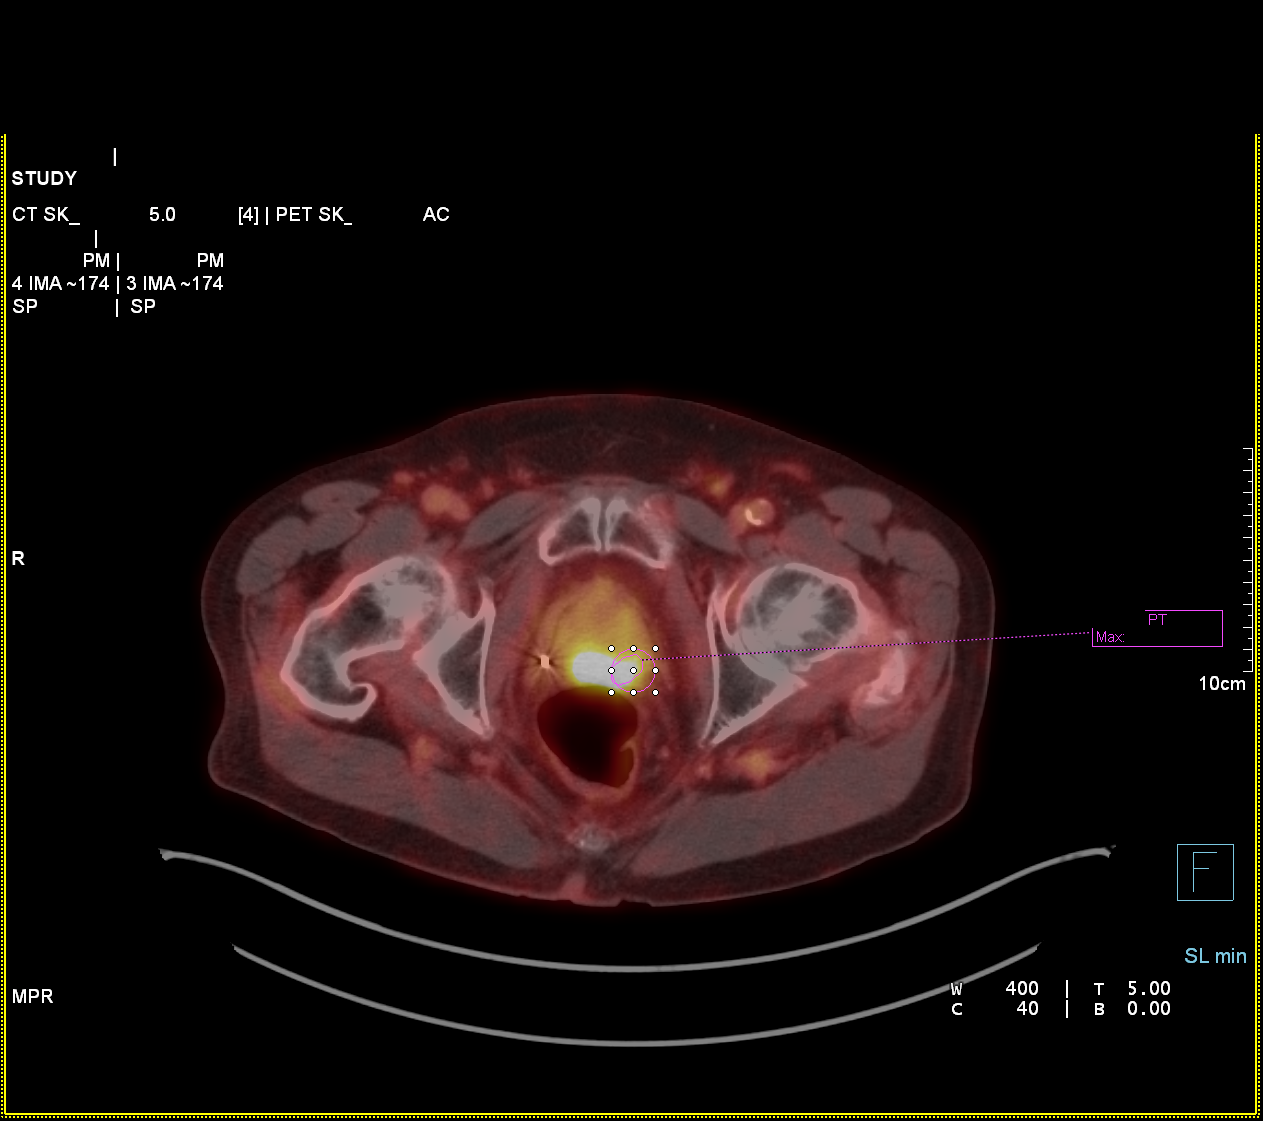
[im 3/3]
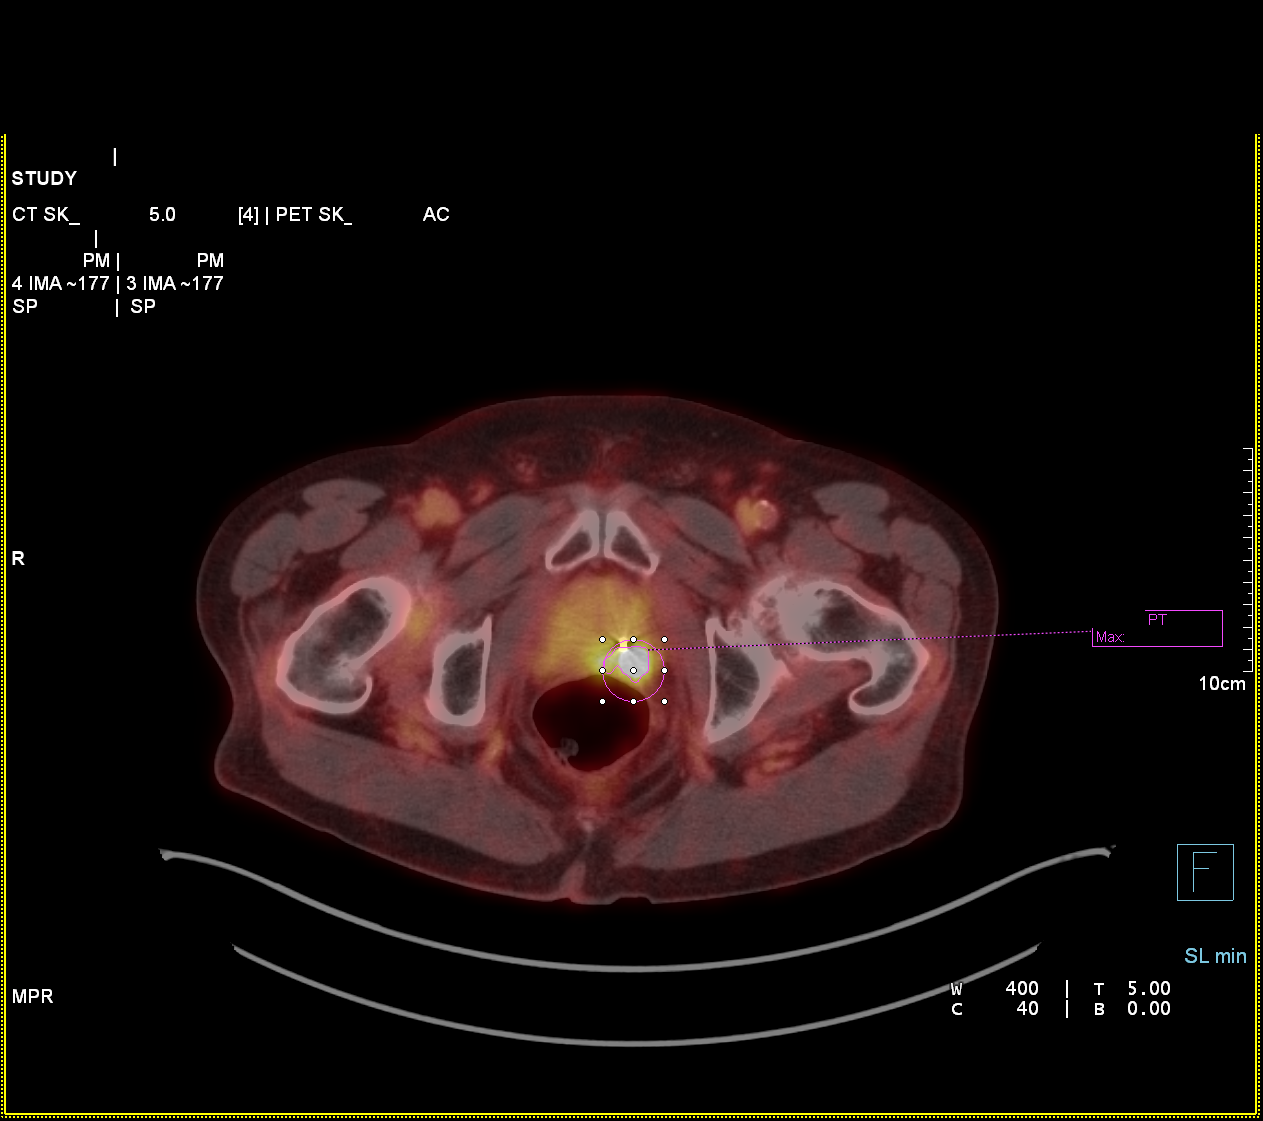

[3 of 3 positions shown; findings below may reference images not displayed]

FINDINGS: NECK

No radiotracer activity in neck lymph nodes.

Incidental CT finding: None

CHEST

No radiotracer accumulation within mediastinal or hilar lymph nodes.
No suspicious pulmonary nodules on the CT scan.

Incidental CT finding: None

ABDOMEN/PELVIS

Prostate: Intense activity within the posterior base of the prostate
gland SUV max equal 9.3. Activity is intense at the junction of the
Seminal vesicles and the base of the gland. Activity does extend
more inferiorly within the LEFT posterior base and posterior mid
gland (images 173 through 179).

Lymph nodes: No abnormal radiotracer accumulation within pelvic or
abdominal nodes.

Liver: No evidence of liver metastasis

Incidental CT finding: None

SKELETON

No focal  activity to suggest skeletal metastasis.
IMPRESSION: 1. Focal intense radiotracer activity at the base of the prostate
gland this most consistent with residual prostate carcinoma.
2. No evidence of metastatic adenopathy in the pelvis.
3. No evidence of visceral metastasis or skeletal metastasis

## 2021-02-23 ENCOUNTER — Ambulatory Visit: Payer: Medicare HMO

## 2021-03-18 DIAGNOSIS — G3184 Mild cognitive impairment, so stated: Secondary | ICD-10-CM | POA: Diagnosis not present

## 2021-03-18 DIAGNOSIS — Z8546 Personal history of malignant neoplasm of prostate: Secondary | ICD-10-CM | POA: Diagnosis not present

## 2021-03-18 DIAGNOSIS — E785 Hyperlipidemia, unspecified: Secondary | ICD-10-CM | POA: Diagnosis not present

## 2021-03-18 DIAGNOSIS — R030 Elevated blood-pressure reading, without diagnosis of hypertension: Secondary | ICD-10-CM | POA: Diagnosis not present

## 2021-03-18 DIAGNOSIS — Z87891 Personal history of nicotine dependence: Secondary | ICD-10-CM | POA: Diagnosis not present

## 2021-03-18 DIAGNOSIS — Z008 Encounter for other general examination: Secondary | ICD-10-CM | POA: Diagnosis not present

## 2021-03-18 DIAGNOSIS — R32 Unspecified urinary incontinence: Secondary | ICD-10-CM | POA: Diagnosis not present

## 2021-03-18 DIAGNOSIS — E119 Type 2 diabetes mellitus without complications: Secondary | ICD-10-CM | POA: Diagnosis not present

## 2021-05-04 DIAGNOSIS — C61 Malignant neoplasm of prostate: Secondary | ICD-10-CM | POA: Diagnosis not present

## 2021-05-11 DIAGNOSIS — R9721 Rising PSA following treatment for malignant neoplasm of prostate: Secondary | ICD-10-CM | POA: Diagnosis not present

## 2021-05-11 DIAGNOSIS — C61 Malignant neoplasm of prostate: Secondary | ICD-10-CM | POA: Diagnosis not present

## 2021-06-22 DIAGNOSIS — R69 Illness, unspecified: Secondary | ICD-10-CM | POA: Diagnosis not present

## 2021-06-22 DIAGNOSIS — Z113 Encounter for screening for infections with a predominantly sexual mode of transmission: Secondary | ICD-10-CM | POA: Diagnosis not present

## 2021-07-28 DIAGNOSIS — M720 Palmar fascial fibromatosis [Dupuytren]: Secondary | ICD-10-CM | POA: Diagnosis not present

## 2021-08-02 DIAGNOSIS — C61 Malignant neoplasm of prostate: Secondary | ICD-10-CM | POA: Diagnosis not present

## 2021-09-01 DIAGNOSIS — M720 Palmar fascial fibromatosis [Dupuytren]: Secondary | ICD-10-CM | POA: Diagnosis not present

## 2021-11-09 DIAGNOSIS — C61 Malignant neoplasm of prostate: Secondary | ICD-10-CM | POA: Diagnosis not present

## 2022-01-12 ENCOUNTER — Ambulatory Visit (INDEPENDENT_AMBULATORY_CARE_PROVIDER_SITE_OTHER): Payer: Medicare HMO | Admitting: *Deleted

## 2022-01-12 DIAGNOSIS — Z0000 Encounter for general adult medical examination without abnormal findings: Secondary | ICD-10-CM | POA: Diagnosis not present

## 2022-01-12 NOTE — Progress Notes (Signed)
Subjective:   Lawrence Wong is a 81 y.o. male who presents for Medicare Annual/Subsequent preventive examination.  I connected with  Lawrence Wong on 01/12/22 by a telephone enabled telemedicine application and verified that I am speaking with the correct person using two identifiers.   I discussed the limitations of evaluation and management by telemedicine. The patient expressed understanding and agreed to proceed.  Patient location: home  Provider location: Tele-Health-home   Review of Systems     Cardiac Risk Factors include: advanced age (>72mn, >>55women);family history of premature cardiovascular disease;hypertension;male gender     Objective:    Today's Vitals   There is no height or weight on file to calculate BMI.     01/12/2022    8:33 AM 06/13/2020    8:45 AM 01/02/2020    1:17 PM 01/05/2018   11:24 AM  Advanced Directives  Does Patient Have a Medical Advance Directive? No No No No  Does patient want to make changes to medical advance directive?  Yes (MAU/Ambulatory/Procedural Areas - Information given)    Would patient like information on creating a medical advance directive? No - Patient declined Yes (MAU/Ambulatory/Procedural Areas - Information given) No - Patient declined Yes (MAU/Ambulatory/Procedural Areas - Information given)    Current Medications (verified) Outpatient Encounter Medications as of 01/12/2022  Medication Sig   atorvastatin (LIPITOR) 10 MG tablet Take 1 tablet (10 mg total) by mouth daily.   No facility-administered encounter medications on file as of 01/12/2022.    Allergies (verified) Patient has no known allergies.   History: Past Medical History:  Diagnosis Date   Allergy    Arthritis    Asthma    Chicken pox    Chronic kidney disease    Colon polyps    Diabetes mellitus without complication (HCC)    GERD (gastroesophageal reflux disease)    Heart murmur    Hyperlipidemia    Hypertension    Prostate cancer ([place]     Syphilis    Past Surgical History:  Procedure Laterality Date   INGUINAL LYMPH NODE BIOPSY     PROSTATE BIOPSY     Family History  Problem Relation Age of Onset   Heart disease Mother    Cancer Neg Hx    Breast cancer Neg Hx    Prostate cancer Neg Hx    Colon cancer Neg Hx    Pancreatic cancer Neg Hx    Social History   Socioeconomic History   Marital status: Single    Spouse name: Not on file   Number of children: 1   Years of education: Not on file   Highest education level: Not on file  Occupational History   Occupation: retired  Tobacco Use   Smoking status: Former    Packs/day: 1.00    Years: 40.00    Total pack years: 40.00    Types: Cigarettes    Quit date: 06/01/1983    Years since quitting: 38.6   Smokeless tobacco: Never   Tobacco comments:    States, "I have been around cigarettes my entire lift. 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      Family: one son - [name] - in [place], does not see him often; step children in [place]      Enjoys: works and keeps busy on the [employer] - mowing      Exercise: working and keeping busy   Diet: eats pretty good      Safety   Seat belts: yes   Guns: Yes  and secure   Safe in relationships: Yes    Social Determinants of Radio broadcast assistant Strain: Low Risk  (01/12/2022)   Overall Financial Resource Strain (CARDIA)    Difficulty of Paying Living Expenses: Not hard at all  Food Insecurity: No Food Insecurity (01/12/2022)   Hunger Vital Sign    Worried About Running Out  of Food in the Last Year: Never true    [place] in the Last Year: Never true  Transportation Needs: No Transportation Needs (01/12/2022)   PRAPARE - Hydrologist (Medical): No    Lack of Transportation (Non-Medical): No  Physical Activity: Inactive (01/12/2022)   Exercise Vital Sign    Days of Exercise per Week: 0 days    Minutes of Exercise per Session: 0 min  Stress: No Stress Concern Present (01/12/2022)   [place]    Feeling of Stress : Not at all  Social Connections: Unknown (01/12/2022)   Social Connection and Isolation Panel [NHANES]    Frequency of Communication with Friends and Family: More than three times a week    Frequency of Social Gatherings with Friends and Family: Once a week    Attends Religious Services: More than 4 times per year    Active Member of [employer] or Organizations: No    Attends Music therapist: More than 4 times per year    Marital Status: Not on file    Tobacco Counseling Counseling given: Not Answered Tobacco comments: States, "I have been around cigarettes my entire lift. 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  no  Interpreter Needed?: No  Information entered by :: Leroy Kennedy LPN   Activities of Daily Living    01/12/2022    8:39 AM  In your present state of health, do you have any difficulty performing the following activities:  Hearing? 0  Vision? 0  Difficulty concentrating or making decisions? 0  Walking or climbing stairs? 0  Dressing or bathing? 0  Doing errands, shopping? 0  Preparing Food and eating ? N  Using  the Toilet? N  In the past six months, have you accidently leaked urine? N  Do you have problems with loss of bowel control? N  Managing your Medications? N  Managing your Finances? N  Housekeeping or managing your Housekeeping? N    Patient Care Team: [name], MD as PCP - General (Family Medicine) Cira Rue, RN as Oncology Nurse Navigator  Indicate any recent Medical Services you may have received from  other than [facility] providers in the past year (date may be approximate).     Assessment:   This is a routine wellness examination for Orren.  Hearing/Vision screen Hearing Screening - Comments:: No trouble hearing Vision Screening - Comments:: Not up to date  Dietary issues and exercise activities discussed: Current Exercise Habits: The patient does not participate in regular exercise at present   Goals Addressed             This Visit's Progress    Patient Stated       No goals       Depression Screen    01/12/2022    8:39 AM 01/12/2021   11:32 AM 01/02/2020    1:18 PM  PHQ 2/9 Scores  PHQ - 2 Score 0 0 0  PHQ- 9 Score   0    Fall Risk    01/12/2022    8:34 AM 01/12/2021   10:36 AM 01/02/2020    1:18 PM  [place] in the past year? 0 0 0  Number falls in past yr: 0 0 0  Injury with Fall? 0  0  Risk for fall due to :   No Fall Risks  Follow up   Falls evaluation completed;Falls prevention discussed    FALL RISK PREVENTION PERTAINING TO THE HOME:  Any stairs in or around the home? Yes  If so, are there any without handrails? No  Home free of loose throw rugs in walkways, pet beds, electrical cords, etc? Yes  Adequate lighting in your home to reduce risk of falls? Yes   ASSISTIVE DEVICES UTILIZED TO PREVENT FALLS:  Life alert? No  Use of a cane, walker or w/c? No  Grab bars in the bathroom? No  Shower chair or bench in shower? No  Elevated toilet seat or a handicapped toilet? No   TIMED UP AND GO:  Was the test performed? No .     Cognitive Function:    01/02/2020    1:21 PM  MMSE - Mini Mental State Exam  Orientation to time 5  Orientation to Place 5  Registration 3  Attention/ Calculation 5  Recall 3  Language- repeat 1        01/12/2022    8:37 AM  6CIT Screen  What Year? 0 points  What month? 0 points  What time? 0 points  Count back from 20 0 points  Months in reverse 0 points  Repeat phrase 0 points  Total Score 0 points    Immunizations Immunization History  Administered Date(s) Administered   Influenza, High Dose Seasonal PF 03/29/2018   Influenza-Unspecified 03/01/2019   PFIZER(Purple Top)SARS-COV-2 Vaccination 07/27/2019, 08/22/2019, 04/30/2020, 10/09/2020   Pneumococcal Conjugate-13 01/08/2016   Pneumococcal Polysaccharide-23 12/27/2011   Tdap 12/27/2011   Zoster Recombinat (Shingrix) 03/28/2017, 02/07/2020    TDAP status: Due, Education has been provided regarding the importance of this vaccine. Advised may receive this vaccine at local pharmacy or Health Dept. Aware to provide a copy of the vaccination record if obtained from local pharmacy or Health Dept. Verbalized acceptance and understanding.  Flu Vaccine status: Up to date  Pneumococcal vaccine status: Up to date  Covid-19 vaccine status: Information provided on how to obtain vaccines.   Qualifies for Shingles Vaccine? No   Zostavax completed No   Shingrix Completed?: Yes  Screening Tests Health Maintenance  Topic Date Due   URINE MICROALBUMIN  Never done   COVID-19 Vaccine (5 - Pfizer risk series)  12/04/2020   TETANUS/TDAP  12/26/2021   INFLUENZA VACCINE  12/29/2021   Pneumonia Vaccine 83+ Years old  Completed   Zoster Vaccines- Shingrix  Completed   HPV VACCINES  Aged Out    Health Maintenance  Health Maintenance Due  Topic Date Due   URINE MICROALBUMIN  Never done   COVID-19 Vaccine (5 - Pfizer risk series) 12/04/2020   TETANUS/TDAP  12/26/2021   INFLUENZA VACCINE  12/29/2021    Colorectal cancer  screening: No longer required.   Patient states he wants another one and will speak with MD at visit 01-13-2022  Lung Cancer Screening: (Low Dose CT Chest recommended if Age 60-80 years, 30 pack-year currently smoking OR have quit w/in 15years.) does not qualify.   Lung Cancer Screening Referral:   Additional Screening:  Hepatitis C Screening: does not qualify; Completed 2021  Vision Screening: Recommended annual ophthalmology exams for early detection of glaucoma and other disorders of the eye. Is the patient up to date with their annual eye exam?  No  Who is the provider or what is the name of the office in which the patient attends annual eye exams?  If pt is not established with a provider, would they like to be referred to a provider to establish care? No .   Dental Screening: Recommended annual dental exams for proper oral hygiene  Community Resource Referral / Chronic Care Management: CRR required this visit?  No   CCM required this visit?  No      Plan:     I have personally reviewed and noted the following in the patient's chart:   Medical and social history Use of alcohol, tobacco or illicit drugs  Current medications and supplements including opioid prescriptions. Patient is not currently taking opioid prescriptions. Functional ability and status Nutritional status Physical activity Advanced directives List of other physicians Hospitalizations, surgeries, and ER visits in previous 12 months Vitals Screenings to include cognitive, depression, and falls Referrals and appointments  In addition, I have reviewed and discussed with patient certain preventive protocols, quality metrics, and best practice recommendations. A written personalized care plan for preventive services as well as general preventive health recommendations were provided to patient.     Leroy Kennedy, LPN   [id number]   Nurse Notes:

## 2022-01-12 NOTE — Patient Instructions (Signed)
[name] , Thank you for taking time to come for your Medicare Wellness Visit. I appreciate your ongoing commitment to your health goals. Please review the following plan we discussed and let me know if I can assist you in the future.   Screening recommendations/referrals: Colonoscopy: no longer required Recommended yearly ophthalmology/optometry visit for glaucoma screening and checkup Recommended yearly dental visit for hygiene and checkup  Vaccinations: Influenza vaccine: Education provided Pneumococcal vaccine: up to date Tdap vaccine: Education provided Shingles vaccine: up to date    Advanced directives: Education provided  Conditions/risks identified:   Next appointment: 01-13-2022 @ 10:00  Lawrence Wong  Preventive Care 80 Years and Older, Male Preventive care refers to lifestyle choices and visits with your health care provider that can promote health and wellness. What does preventive care include? A yearly physical exam. This is also called an annual well check. Dental exams once or twice a year. Routine eye exams. Ask your health care provider how often you should have your eyes checked. Personal lifestyle choices, including: Daily care of your teeth and gums. Regular physical activity. Eating a healthy diet. Avoiding tobacco and drug use. Limiting alcohol use. Practicing safe sex. Taking low doses of aspirin every day. Taking vitamin and mineral supplements as recommended by your health care provider. What happens during an annual well check? The services and screenings done by your health care provider during your annual well check will depend on your age, overall health, lifestyle risk factors, and family history of disease. Counseling  Your health care provider may ask you questions about your: Alcohol use. Tobacco use. Drug use. Emotional well-being. Home and relationship well-being. Sexual activity. Eating habits. History of falls. Memory and ability to  understand (cognition). Work and work Statistician. Screening  You may have the following tests or measurements: Height, weight, and BMI. Blood pressure. Lipid and cholesterol levels. These may be checked every 5 years, or more frequently if you are over 26 years old. Skin check. Lung cancer screening. You may have this screening every year starting at age 54 if you have a 30-pack-year history of smoking and currently smoke or have quit within the past 15 years. Fecal occult blood test (FOBT) of the stool. You may have this test every year starting at age 4. Flexible sigmoidoscopy or colonoscopy. You may have a sigmoidoscopy every 5 years or a colonoscopy every 10 years starting at age 43. Prostate cancer screening. Recommendations will vary depending on your family history and other risks. Hepatitis C blood test. Hepatitis B blood test. Sexually transmitted disease (STD) testing. Diabetes screening. This is done by checking your blood sugar (glucose) after you have not eaten for a while (fasting). You may have this done every 1-3 years. Abdominal aortic aneurysm (AAA) screening. You may need this if you are a current or former smoker. Osteoporosis. You may be screened starting at age 27 if you are at high risk. Talk with your health care provider about your test results, treatment options, and if necessary, the need for more tests. Vaccines  Your health care provider may recommend certain vaccines, such as: Influenza vaccine. This is recommended every year. Tetanus, diphtheria, and acellular pertussis (Tdap, Td) vaccine. You may need a Td booster every 10 years. Zoster vaccine. You may need this after age 37. Pneumococcal 13-valent conjugate (PCV13) vaccine. One dose is recommended after age 44. Pneumococcal polysaccharide (PPSV23) vaccine. One dose is recommended after age 63. Talk to your health care provider about which screenings and vaccines  you need and how often you need them. This  information is not intended to replace advice given to you by your health care provider. Make sure you discuss any questions you have with your health care provider. Document Released: 06/13/2015 Document Revised: 02/04/2016 Document Reviewed: 03/18/2015 Elsevier Interactive Patient Education  2017 [place] Prevention in the Home Falls can cause injuries. They can happen to people of all ages. There are many things you can do to make your home safe and to help prevent falls. What can I do on the outside of my home? Regularly fix the edges of walkways and driveways and fix any cracks. Remove anything that might make you trip as you walk through a door, such as a raised step or threshold. Trim any bushes or trees on the path to your home. Use bright outdoor lighting. Clear any walking paths of anything that might make someone trip, such as rocks or tools. Regularly check to see if handrails are loose or broken. Make sure that both sides of any steps have handrails. Any raised decks and porches should have guardrails on the edges. Have any leaves, snow, or ice cleared regularly. Use sand or salt on walking paths during winter. Clean up any spills in your garage right away. This includes oil or grease spills. What can I do in the bathroom? Use night lights. Install grab bars by the toilet and in the tub and shower. Do not use towel bars as grab bars. Use non-skid mats or decals in the tub or shower. If you need to sit down in the shower, use a plastic, non-slip stool. Keep the floor dry. Clean up any water that spills on the floor as soon as it happens. Remove soap buildup in the tub or shower regularly. Attach bath mats securely with double-sided non-slip rug tape. Do not have throw rugs and other things on the floor that can make you trip. What can I do in the bedroom? Use night lights. Make sure that you have a light by your bed that is easy to reach. Do not use any sheets or  blankets that are too big for your bed. They should not hang down onto the floor. Have a firm chair that has side arms. You can use this for support while you get dressed. Do not have throw rugs and other things on the floor that can make you trip. What can I do in the kitchen? Clean up any spills right away. Avoid walking on wet floors. Keep items that you use a lot in easy-to-reach places. If you need to reach something above you, use a strong step stool that has a grab bar. Keep electrical cords out of the way. Do not use floor polish or wax that makes floors slippery. If you must use wax, use non-skid floor wax. Do not have throw rugs and other things on the floor that can make you trip. What can I do with my stairs? Do not leave any items on the stairs. Make sure that there are handrails on both sides of the stairs and use them. Fix handrails that are broken or loose. Make sure that handrails are as long as the stairways. Check any carpeting to make sure that it is firmly attached to the stairs. Fix any carpet that is loose or worn. Avoid having throw rugs at the top or bottom of the stairs. If you do have throw rugs, attach them to the floor with carpet tape. Make sure  that you have a light switch at the top of the stairs and the bottom of the stairs. If you do not have them, ask someone to add them for you. What else can I do to help prevent falls? Wear shoes that: Do not have high heels. Have rubber bottoms. Are comfortable and fit you well. Are closed at the toe. Do not wear sandals. If you use a stepladder: Make sure that it is fully opened. Do not climb a closed stepladder. Make sure that both sides of the stepladder are locked into place. Ask someone to hold it for you, if possible. Clearly mark and make sure that you can see: Any grab bars or handrails. First and last steps. Where the edge of each step is. Use tools that help you move around (mobility aids) if they are  needed. These include: Canes. Walkers. Scooters. Crutches. Turn on the lights when you go into a dark area. Replace any light bulbs as soon as they burn out. Set up your furniture so you have a clear path. Avoid moving your furniture around. If any of your floors are uneven, fix them. If there are any pets around you, be aware of where they are. Review your medicines with your doctor. Some medicines can make you feel dizzy. This can increase your chance of falling. Ask your doctor what other things that you can do to help prevent falls. This information is not intended to replace advice given to you by your health care provider. Make sure you discuss any questions you have with your health care provider. Document Released: 03/13/2009 Document Revised: 10/23/2015 Document Reviewed: 06/21/2014 Elsevier Interactive Patient Education  2017 [employer].

## 2022-01-13 ENCOUNTER — Ambulatory Visit (INDEPENDENT_AMBULATORY_CARE_PROVIDER_SITE_OTHER): Payer: Medicare HMO | Admitting: Family Medicine

## 2022-01-13 VITALS — BP 120/60 | HR 78 | Temp 97.5°F | Ht 68.0 in | Wt 156.0 lb

## 2022-01-13 DIAGNOSIS — H9193 Unspecified hearing loss, bilateral: Secondary | ICD-10-CM | POA: Diagnosis not present

## 2022-01-13 DIAGNOSIS — R5383 Other fatigue: Secondary | ICD-10-CM

## 2022-01-13 DIAGNOSIS — Z0000 Encounter for general adult medical examination without abnormal findings: Secondary | ICD-10-CM | POA: Diagnosis not present

## 2022-01-13 DIAGNOSIS — Z86010 Personal history of colonic polyps: Secondary | ICD-10-CM | POA: Diagnosis not present

## 2022-01-13 DIAGNOSIS — Z8546 Personal history of malignant neoplasm of prostate: Secondary | ICD-10-CM | POA: Diagnosis not present

## 2022-01-13 DIAGNOSIS — E782 Mixed hyperlipidemia: Secondary | ICD-10-CM

## 2022-01-13 DIAGNOSIS — E785 Hyperlipidemia, unspecified: Secondary | ICD-10-CM | POA: Diagnosis not present

## 2022-01-13 DIAGNOSIS — R7303 Prediabetes: Secondary | ICD-10-CM | POA: Insufficient documentation

## 2022-01-13 DIAGNOSIS — N529 Male erectile dysfunction, unspecified: Secondary | ICD-10-CM | POA: Diagnosis not present

## 2022-01-13 DIAGNOSIS — R030 Elevated blood-pressure reading, without diagnosis of hypertension: Secondary | ICD-10-CM | POA: Diagnosis not present

## 2022-01-13 DIAGNOSIS — Z87891 Personal history of nicotine dependence: Secondary | ICD-10-CM | POA: Diagnosis not present

## 2022-01-13 LAB — LIPID PANEL
Cholesterol: 97 mg/dL (ref 0–200)
HDL: 25.6 mg/dL — ABNORMAL LOW (ref >39.00)
LDL Cholesterol: 60 mg/dL (ref 0–99)
NonHDL: 71.71
Total CHOL/HDL Ratio: 4
Triglycerides: 58 mg/dL (ref 0.0–149.0)
VLDL: 11.6 mg/dL (ref 0.0–40.0)

## 2022-01-13 LAB — MICROALBUMIN / CREATININE URINE RATIO
Creatinine,U: 158.7 mg/dL
Microalb Creat Ratio: 0.8 mg/g (ref 0.0–30.0)
Microalb, Ur: 1.3 mg/dL (ref 0.0–1.9)

## 2022-01-13 LAB — COMPREHENSIVE METABOLIC PANEL
ALT: 14 U/L (ref 0–53)
AST: 21 U/L (ref 0–37)
Albumin: 4 g/dL (ref 3.5–5.2)
Alkaline Phosphatase: 51 U/L (ref 39–117)
BUN: 17 mg/dL (ref 6–23)
CO2: 27 mEq/L (ref 19–32)
Calcium: 9.5 mg/dL (ref 8.4–10.5)
Chloride: 102 mEq/L (ref 96–112)
Creatinine, Ser: 1.05 mg/dL (ref 0.40–1.50)
GFR: 67.13 mL/min (ref >60.00)
Glucose, Bld: 102 mg/dL — ABNORMAL HIGH (ref 70–99)
Potassium: 4.3 mEq/L (ref 3.5–5.1)
Sodium: 137 mEq/L (ref 135–145)
Total Bilirubin: 1 mg/dL (ref 0.2–1.2)
Total Protein: 7 g/dL (ref 6.0–8.3)

## 2022-01-13 LAB — CBC
HCT: 41.4 % (ref 39.0–52.0)
Hemoglobin: 13.7 g/dL (ref 13.0–17.0)
MCHC: 33.1 g/dL (ref 30.0–36.0)
MCV: 99.2 fl (ref 78.0–100.0)
Platelets: 193 10*3/uL (ref 150.0–400.0)
RBC: 4.17 Mil/uL — ABNORMAL LOW (ref 4.22–5.81)
RDW: 14.2 % (ref 11.5–15.5)
WBC: 7 10*3/uL (ref 4.0–10.5)

## 2022-01-13 LAB — VITAMIN D 25 HYDROXY (VIT D DEFICIENCY, FRACTURES): VITD: 21.91 ng/mL — ABNORMAL LOW (ref 30.00–100.00)

## 2022-01-13 LAB — VITAMIN B12: Vitamin B-12: 201 pg/mL — ABNORMAL LOW (ref 211–911)

## 2022-01-13 LAB — HEMOGLOBIN A1C: Hgb A1c MFr Bld: 6.1 % (ref 4.6–6.5)

## 2022-01-13 MED ORDER — ATORVASTATIN CALCIUM 10 MG PO TABS: 10.0000 mg | ORAL_TABLET | Freq: Every day | ORAL | 3 refills | Status: DC

## 2022-01-13 NOTE — Patient Instructions (Addendum)
Referral to gastroenterology - you may need another colonoscopy but you can meet with the doctor  Pharmacy - for tdap (every 10 years)    #Referral I have placed a referral to a specialist for you. You should receive a phone call from the specialty office. Make sure your voicemail is not full and that if you are able to answer your phone to unknown or new numbers.   It may take up to 2 weeks to hear about the referral. If you do not hear anything in 2 weeks, please call our office and ask to speak with the referral coordinator.   Check with insurance company for who is in-network for Optometry  - if you have trouble let me know

## 2022-01-13 NOTE — Progress Notes (Signed)
Annual Exam   Chief Complaint:  Chief Complaint  Patient presents with   Medicare Wellness    Part 2    History of Present Illness:  Lawrence Wong is a 81 y.o. presents today for annual examination.     Nutrition/Lifestyle Diet: doing ok, low energy Exercise: up and moving, working daily - mowing yards, and cutting trees He is not sexually active.  Any issues with getting or keeping erection? Yes  Social History   Tobacco Use  Smoking Status Former   Packs/day: 1.00   Years: 40.00   Total pack years: 40.00   Types: Cigarettes   Quit date: 06/01/1983   Years since quitting: 38.6  Smokeless Tobacco Never  Tobacco Comments   States, "I have been around cigarettes my entire lift. 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     The patient feels safe at home and in their relationships: yes.  General Health Dentist in the last year: Yes Eye doctor: no  Weight Wt Readings from Last 3 Encounters:  01/13/22 156 lb (70.8 kg)  01/12/21 161 lb (73 kg)  06/13/20 171 lb 3.2 oz (77.7 kg)   Patient has normal BMI  BMI Readings from Last 1 Encounters:  01/13/22 23.72 kg/m     Chronic disease screening Blood pressure monitoring:  BP Readings from Last 3 Encounters:  01/13/22 120/60  01/12/21 132/70  06/13/20 (!) 151/88    Lipid Monitoring: Indication for screening: age >35, obesity, diabetes, family hx, CV risk factors.  Lipid screening: Yes  Lab Results  Component Value Date   CHOL 117 01/12/2021   HDL 30.60 (L) 01/12/2021   LDLCALC 67 01/12/2021   TRIG 100.0 01/12/2021   CHOLHDL 4 01/12/2021     Diabetes Screening: age >72, overweight, family hx, PCOS, hx of gestational diabetes, at risk ethnicity, elevated blood pressure >135/80.   Diabetes Screening screening: Yes  Lab Results  Component Value Date   HGBA1C 5.8 01/12/2021     Prostate Cancer Screening: Yes Age 77-69 yo Shared Decision Making Higher Risk: Older age, African American, Family Hx of Prostate Cancer - Yes Benefits: screening may prevent 1.3 deaths from prostate cancer over 13 years per 1000 men screened and prevent 3 metastatic cases per 1000 men screened. Not enough evidence to support more benefit for AA or [place] Harms: False Positive and psychological harms. 15% of me with false positive over a 2 to 4 year period > resulting in biopsy and complications such as pain, hematospermia, infections. Overdiagnosis - increases with age - found that 20-50% of prostate cancer through screening may have never caused any issues. Harms of treatment include - erectile dysfunction, urinary incontinence, and bothersome bowel symptoms.   After discussion he does want to get a PSA checked today.   Inadequate evidence for screening <55 No mortality benefit for screening >70   Lab Results  Component Value Date   PSA 28.9 09/15/2017       Colon Cancer Screening:  No longer indicated   Social History   Tobacco Use  Smoking Status Former   Packs/day: 1.00   Years: 40.00   Total pack years: 40.00   Types: Cigarettes   Quit date: 06/01/1983   Years  since quitting: 38.6  Smokeless Tobacco Never  Tobacco Comments   States, "I have been around cigarettes my entire lift. 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."    Lung Cancer Screening (Ages 40-10): not applicable   Abdominal Aortic Aneurysm:  Age 101-75, 1 time screening, men who have ever smoked done    Past Medical History:  Diagnosis Date   Allergy    Arthritis    Asthma    Chicken pox    Chronic kidney disease    Colon polyps    Diabetes mellitus without complication (HCC)    GERD (gastroesophageal reflux disease)    Heart murmur    Hyperlipidemia    Hypertension    Prostate  cancer ([place])    Syphilis     Past Surgical History:  Procedure Laterality Date   INGUINAL LYMPH NODE BIOPSY     PROSTATE BIOPSY      Prior to Admission medications   Medication Sig Start Date End Date Taking? Authorizing Provider  atorvastatin (LIPITOR) 10 MG tablet Take 1 tablet (10 mg total) by mouth daily. 01/12/21  Yes [name], MD    No Known Allergies   Social History   Socioeconomic History   Marital status: Single    Spouse name: Not on file   Number of children: 1   Years of education: Not on file   Highest education level: Not on file  Occupational History   Occupation: retired  Tobacco Use   Smoking status: Former    Packs/day: 1.00    Years: 40.00    Total pack years: 40.00    Types: Cigarettes    Quit date: 06/01/1983    Years since quitting: 38.6   Smokeless tobacco: Never   Tobacco comments:    States, "I have been around cigarettes my entire lift. 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      Family: one son - [place] - in [place], does not see him often; step children in [place]      Enjoys: works and keeps busy on the [employer] - mowing      Exercise: working and keeping busy   Diet: eats pretty good      Safety   Seat belts: yes   Guns: Yes  and secure   Safe in relationships: Yes    Social Determinants of Radio broadcast assistant Strain: [place]  (01/12/2022)   Overall Financial Resource Strain (CARDIA)    Difficulty of Paying Living Expenses: Not hard at all  Food Insecurity: No Food  Insecurity (01/12/2022)   Hunger Vital Sign    Worried About Running Out of Food in the Last Year: Never true    [place] in the Last Year: Never true  Transportation Needs: No Transportation Needs (01/12/2022)   PRAPARE - Hydrologist (Medical): No    Lack of Transportation (Non-Medical): No  Physical Activity: Inactive (01/12/2022)   Exercise Vital Sign    Days of Exercise per Week: 0 days    Minutes of Exercise per Session: 0 min  Stress: No Stress Concern Present (01/12/2022)   [place]    Feeling of Stress : Not at all  Social Connections: Unknown (01/12/2022)   Social Connection and Isolation Panel [NHANES]    Frequency of Communication with Friends and Family: More than three times a week    Frequency of Social Gatherings with Friends and Family: Once a week    Attends Religious Services: More than 4 times per year    Active Member of [employer] or Organizations: No    Attends Music therapist: More than 4 times per year    Marital Status: Not on file  Intimate Partner Violence: Not At Risk (01/12/2022)   Humiliation, Afraid, Rape, and Kick questionnaire    Fear of Current or Ex-Partner: No    Emotionally Abused: No    Physically Abused: No    Sexually Abused: No    Family History  Problem Relation Age of Onset   Heart disease Mother    Cancer Neg Hx    Breast cancer Neg Hx    Prostate cancer Neg Hx    Colon cancer Neg Hx    Pancreatic cancer Neg Hx     Review of Systems  Constitutional:  Positive for malaise/fatigue. Negative for chills and fever.  HENT:  Negative for congestion and sore throat.   Eyes:  Negative for blurred vision and double vision.  Respiratory:  Negative for shortness of breath.   Cardiovascular:  Negative for chest pain.  Gastrointestinal:  Negative for heartburn, nausea and vomiting.  Genitourinary: Negative.   Musculoskeletal:  Negative.  Negative for myalgias.  Skin:  Negative for rash.  Neurological:  Negative for dizziness and headaches.  Endo/Heme/Allergies:  Does not bruise/bleed easily.  Psychiatric/Behavioral:  Negative for depression. The patient is not nervous/anxious.      Physical Exam BP 120/60   Pulse 78   Temp (!) 97.5 F (36.4 C) (Temporal)   Ht '5\' 8"'$  (1.727 m)   Wt 156 lb (70.8 kg)   SpO2 97%   BMI 23.72 kg/m    BP Readings from Last 3 Encounters:  01/13/22 120/60  01/12/21 132/70  06/13/20 (!) 151/88      Physical Exam Constitutional:      General: He is not in acute distress.    Appearance: He is well-developed. He is not diaphoretic.  HENT:     Head: Normocephalic and atraumatic.     Right Ear: Tympanic membrane and ear canal normal.     Left Ear: Tympanic membrane and ear canal normal.     Nose: Nose normal.     Mouth/Throat:     Pharynx: Uvula midline.  Eyes:     General: No scleral icterus.    Conjunctiva/sclera: Conjunctivae normal.     Pupils: Pupils are equal, round, and reactive to light.  Cardiovascular:     Rate and Rhythm: Normal rate and regular rhythm.     Heart sounds: Murmur heard.  Pulmonary:     Effort: Pulmonary effort is normal. No respiratory distress.     Breath sounds: Normal breath sounds. No wheezing.  Abdominal:     General: Bowel sounds are normal. There is no distension.     Palpations: Abdomen is soft. There is no mass.     Tenderness:  There is no abdominal tenderness. There is no guarding.  Musculoskeletal:        General: Normal range of motion.     Cervical back: Normal range of motion and neck supple.  Lymphadenopathy:     Cervical: No cervical adenopathy.  Skin:    General: Skin is warm and dry.     Capillary Refill: Capillary refill takes less than 2 seconds.  Neurological:     Mental Status: He is alert and oriented to person, place, and time.        Results:  PHQ-9:  Flowsheet Row Clinical Support from 01/02/2020 in  [facility]  PHQ-9 Total Score 0         Assessment: 81 y.o. here for routine annual physical examination.  Plan: Problem List Items Addressed This Visit       Other   Hyperlipidemia   Relevant Medications   atorvastatin (LIPITOR) 10 MG tablet   Other Relevant Orders   Comprehensive metabolic panel   Lipid panel   Personal history of colonic polyps   Relevant Orders   Ambulatory referral to Gastroenterology   CBC   Prediabetes   Relevant Orders   Hemoglobin A1c   Microalbumin / creatinine urine ratio   Other Visit Diagnoses     Annual physical exam    -  Primary   Other fatigue       Relevant Orders   CBC   Vitamin B12   Vitamin D, 25-hydroxy   Bilateral hearing loss, unspecified hearing loss type       Relevant Orders   Ambulatory referral to Audiology       Screening: -- Blood pressure screen normal -- cholesterol screening: will obtain -- Weight screening: normal -- Diabetes Screening: will obtain -- Nutrition: normal - Encouraged healthy diet  The ASCVD Risk score (Arnett DK, et al., 2019) failed to calculate for the following reasons:   The 2019 ASCVD risk score is only valid for ages 64 to 64  -- ASA 81 mg discussed if CVD risk >10% age 44-59 and willing to take for 10 years -- Statin therapy for Age 56-75 with CVD risk >7.5%  Psych -- Depression screening (PHQ-9): negative  Safety -- tobacco screening: not using -- alcohol screening:  low-risk usage. -- no evidence of domestic violence or intimate partner violence.  Cancer Screening -- Prostate (age 79-69)  following for treatment -- Colon (age 9-75)  he was told to return in 5 years, referral placed, discussed he may not need screening -- Lung not indicated   Immunizations Immunization History  Administered Date(s) Administered   Influenza, High Dose Seasonal PF 03/29/2018   Influenza-Unspecified 03/01/2019   PFIZER(Purple Top)SARS-COV-2 Vaccination  07/27/2019, 08/22/2019, 04/30/2020, 10/09/2020   Pneumococcal Conjugate-13 01/08/2016   Pneumococcal Polysaccharide-23 12/27/2011   Tdap 12/27/2011   Zoster Recombinat (Shingrix) 03/28/2017, 02/07/2020    -- flu vaccine encouraged getting once available -- TDAP q10 years not up to date - encouraged getting -- Shingles (age >26) up to date -- PPSV-23 (19-64 with chronic disease or smoking) up to date -- PCV-13 (age >28) - one dose followed by PPSV-23 1 year later up to date -- Covid-19 Vaccine up to date  Encouraged regular vision and dental screening. Encouraged healthy exercise and diet.   [name]

## 2022-01-14 ENCOUNTER — Other Ambulatory Visit: Payer: Self-pay | Admitting: Family Medicine

## 2022-01-14 DIAGNOSIS — E559 Vitamin D deficiency, unspecified: Secondary | ICD-10-CM

## 2022-01-14 MED ORDER — VITAMIN D (ERGOCALCIFEROL) 1.25 MG (50000 UNIT) PO CAPS: 50000.0000 [IU] | ORAL_CAPSULE | ORAL | 1 refills | Status: DC

## 2022-01-20 ENCOUNTER — Telehealth: Payer: Self-pay | Admitting: Family Medicine

## 2022-01-20 NOTE — Telephone Encounter (Signed)
Patient returned call about message below:   Loreen Freud, [facility]  01/20/2022  4:27 PM EDT Back to Top    Left VM asking pt to call me back. No DPR   [name], MD  01/14/2022 12:09 PM EDT     Please call pt to relay lab results.    1) B12 is low - recommend once monthly injections x 3 months and schedule TOC with Matt or Tabitha for recheck   2) Your vitamin D level was low.  I'm going to send in a high-dose replacement that I would like you to take for 8 weeks.  After you finish this you should start a supplement of 800 to 1000 units daily.   Prediabetes is stable. Cholesterol, liver, and kidney function are also stable.    [place] him know what message said and he asked for a call back from nurse. Please advise

## 2022-01-21 NOTE — Telephone Encounter (Signed)
Attempted to call pt and still no answer.

## 2022-01-27 NOTE — Telephone Encounter (Signed)
Spoke to pt and relayed info to him, as well as scheduled him for first b12

## 2022-01-28 ENCOUNTER — Ambulatory Visit (INDEPENDENT_AMBULATORY_CARE_PROVIDER_SITE_OTHER): Payer: Medicare HMO

## 2022-01-28 DIAGNOSIS — E538 Deficiency of other specified B group vitamins: Secondary | ICD-10-CM | POA: Diagnosis not present

## 2022-01-28 MED ORDER — CYANOCOBALAMIN 1000 MCG/ML IJ SOLN
1000.0000 ug | Freq: Once | INTRAMUSCULAR | Status: AC
  Administered 2022-01-28: 1000 ug via INTRAMUSCULAR

## 2022-01-28 NOTE — Progress Notes (Signed)
Per orders of Dr. Waunita Schooner, injection of Vitamin B 12 given in left deltoid by Ozzie Hoyle. Patient tolerated injection well.

## 2022-02-02 ENCOUNTER — Telehealth: Payer: Self-pay

## 2022-02-02 NOTE — Patient Outreach (Addendum)
  Care Coordination   Initial Visit Note   02/02/2022 Name: Lawrence Wong MRN: [id number] DOB: [birth year]  Lawrence Wong is a 81 y.o. year old male who sees Lawrence Wong, Lawrence Marker, MD for primary care. I spoke with  Lawrence Wong by phone today.  What matters to the patients health and wellness today?  Patient denies any acute needs or concerns at present. States things are going well as far as his health is concerned He remains independent with ADLs/IADLs-drives himself to appts. No issues with meds.     Goals Addressed             This Visit's Progress    COMPLETED: Care Coordination-no follow up required       Care Coordination Interventions: Patient interviewed about adult health maintenance status including  AWV(completed on 01/12/22) immunizations Advised patient to discuss  COVID vaccination    with primary care provider  Documented Historical Immunization accuracy confirmed  Assesed SDOH          SDOH assessments and interventions completed:  Yes  SDOH Interventions Today    Flowsheet Row Most Recent Value  SDOH Interventions   Food Insecurity Interventions Intervention Not Indicated  Transportation Interventions Intervention Not Indicated        Care Coordination Interventions Activated:  Yes  Care Coordination Interventions:  Yes, provided info regarding vaccinations and other health maintenance  Follow up plan: No further intervention required.   Encounter Outcome:  Pt. Visit Completed Patient agreeael to RN [facility] mailing [facility] letter for future reference.  Enzo Montgomery, RN,BSN,CCM [facility] Telephonic Care Management Coordinator Direct Phone: [phone] Toll Free: [phone] Fax: [phone]

## 2022-02-23 ENCOUNTER — Ambulatory Visit: Payer: Medicare HMO

## 2022-03-02 ENCOUNTER — Ambulatory Visit (INDEPENDENT_AMBULATORY_CARE_PROVIDER_SITE_OTHER): Payer: Medicare HMO | Admitting: *Deleted

## 2022-03-02 DIAGNOSIS — E538 Deficiency of other specified B group vitamins: Secondary | ICD-10-CM

## 2022-03-02 MED ORDER — CYANOCOBALAMIN 1000 MCG/ML IJ SOLN
1000.0000 ug | Freq: Once | INTRAMUSCULAR | Status: AC
  Administered 2022-03-02: 1000 ug via INTRAMUSCULAR

## 2022-03-02 NOTE — Progress Notes (Signed)
Per orders of Dr. Diona Browner, injection of Vitamin B-12 given by Emelia Salisbury. Patient tolerated injection well.

## 2022-03-22 ENCOUNTER — Encounter: Payer: Self-pay | Admitting: Family

## 2022-03-22 ENCOUNTER — Ambulatory Visit (INDEPENDENT_AMBULATORY_CARE_PROVIDER_SITE_OTHER): Payer: Medicare HMO | Admitting: Family

## 2022-03-22 DIAGNOSIS — C61 Malignant neoplasm of prostate: Secondary | ICD-10-CM | POA: Diagnosis not present

## 2022-03-22 DIAGNOSIS — R7303 Prediabetes: Secondary | ICD-10-CM

## 2022-03-22 DIAGNOSIS — E782 Mixed hyperlipidemia: Secondary | ICD-10-CM

## 2022-03-22 NOTE — Progress Notes (Signed)
Established Patient Office Visit  Subjective:  Patient ID: Lawrence Wong, male    DOB: Sep 27, 1940  Age: 81 y.o. MRN: [id number]  CC:  Chief Complaint  Patient presents with   Transitions Of Care    HPI Lawrence Wong is here for a transition of care visit.  Prior provider was: Dr. Waunita Schooner MD Pt is without acute concerns.   Overdue for tetanus vaccination, last in 2013.   chronic concerns:  HLD: on lipitor 10 mg once daily.  Lab Results  Component Value Date   CHOL 97 01/13/2022   HDL 25.60 (L) 01/13/2022   LDLCALC 60 01/13/2022   TRIG 58.0 01/13/2022   CHOLHDL 4 01/13/2022   Has lost weight intentionally by focusing on diet and exercise.  Wt Readings from Last 3 Encounters:  03/22/22 151 lb 2 oz (68.5 kg)  01/13/22 156 lb (70.8 kg)  01/12/21 161 lb (73 kg)   Prostate cancer:  per pt levels are stable, and pt is due for f/u in December. Folllowed by urology Dr. Diona Fanti.   H/o HTN: this year has been stable, under 120/80 or so. Doing well. No longer requiring mediation management.   Prediabetes: increase from one year from 5.8 to 6.1   Past Medical History:  Diagnosis Date   Allergy    Arthritis    Asthma    Chicken pox    Chronic kidney disease    Colon polyps    Diabetes mellitus without complication (HCC)    GERD (gastroesophageal reflux disease)    Heart murmur    Hyperlipidemia    Hypertension    Prostate cancer ([place])    Syphilis     Past Surgical History:  Procedure Laterality Date   INGUINAL LYMPH NODE BIOPSY     PROSTATE BIOPSY      Family History  Problem Relation Age of Onset   Heart disease Mother    Cancer Neg Hx    Breast cancer Neg Hx    Prostate cancer Neg Hx    Colon cancer Neg Hx    Pancreatic cancer Neg Hx     Social History   Socioeconomic History   Marital status: Single    Spouse name: Not on file   Number of children: 1   Years of education: Not on file   Highest education level: Not on file  Occupational  History   Occupation: retired   Occupation: retired  Tobacco Use   Smoking status: Former    Packs/day: 1.00    Years: 40.00    Total pack years: 40.00    Types: Cigarettes    Quit date: 06/01/1983    Years since quitting: 38.8   Smokeless tobacco: Never   Tobacco comments:    States, "I have been around cigarettes my entire lift. 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      Family: one son - [place] - in [place], does not see him often;  step children in [place]      Enjoys: works and keeps busy on the [employer] - mowing      Exercise: working and keeping busy   Diet: eats pretty good      Safety   Seat belts: yes   Guns: Yes  and secure   Safe in relationships: Yes    Social Determinants of Health   Financial Resource Strain: Low Risk  (01/12/2022)   Overall Financial Resource Strain (CARDIA)    Difficulty of Paying Living Expenses: Not hard at all  Food Insecurity: No Food Insecurity (02/02/2022)   Hunger Vital Sign    Worried About Running Out of Food in the Last Year: Never true    [place] in the Last Year: Never true  Transportation Needs: No Transportation Needs (02/02/2022)   PRAPARE - Hydrologist (Medical): No    Lack of Transportation (Non-Medical): No  Physical Activity: Inactive (01/12/2022)   Exercise Vital Sign    Days of Exercise per Week: 0 days    Minutes of Exercise per Session: 0 min  Stress: No Stress Concern Present (01/12/2022)   [place]    Feeling of Stress : Not at all  Social Connections: Unknown (01/12/2022)   Social Connection and Isolation Panel [NHANES]    Frequency of Communication with Friends and Family: More than three times a week    Frequency of Social Gatherings with Friends and Family: Once a week    Attends Religious Services: More than 4 times per year    Active Member of [employer] or Organizations: No    Attends Music therapist: More than 4 times per year    Marital Status: Not on file  Intimate Partner Violence: Not At Risk (01/12/2022)   Humiliation, Afraid, Rape, and Kick questionnaire    Fear of Current or Ex-Partner: No    Emotionally Abused: No    Physically Abused: No    Sexually Abused: No    Outpatient Medications Prior to Visit  Medication Sig Dispense Refill   atorvastatin (LIPITOR) 10 MG tablet Take 1 tablet (10 mg total) by mouth daily. 90 tablet 3   cyanocobalamin (VITAMIN B12) 1000 MCG tablet Take 1,000 mcg by mouth daily.     Vitamin D, Ergocalciferol, (DRISDOL) 1.25 MG (50000 UNIT) CAPS capsule Take 1 capsule (50,000 Units total) by mouth every 7 (seven) days. (Patient not taking: Reported on 03/22/2022) 4 capsule 1   No facility-administered medications prior to visit.    No Known Allergies      Objective:    Physical Exam Constitutional:      General: He is not in acute distress.    Appearance: Normal appearance. He is normal weight. He is not ill-appearing, toxic-appearing or diaphoretic.  HENT:     Head: Normocephalic.     Right Ear: Tympanic membrane normal.     Left Ear: Tympanic membrane normal.     Nose: Nose normal.  Eyes:     Pupils: Pupils are equal, round, and reactive to light.  Cardiovascular:     Rate and Rhythm: Normal rate and regular rhythm.  Pulmonary:     Effort: Pulmonary effort is normal.     Breath sounds: Normal breath sounds.  Abdominal:     General: Abdomen is flat. Bowel sounds are normal.      Palpations: Abdomen is soft.     Tenderness: There is no  abdominal tenderness.  Musculoskeletal:        General: Normal range of motion.     Cervical back: Normal range of motion.  Skin:    General: Skin is warm.  Neurological:     General: No focal deficit present.     Mental Status: He is alert and oriented to person, place, and time.     Motor: No weakness.     Gait: Gait normal.  Psychiatric:        Mood and Affect: Mood normal.        Behavior: Behavior normal.        Thought Content: Thought content normal.        Judgment: Judgment normal.     BP 118/74   Pulse 75   Temp 98.6 F (37 C)   Resp 16   Ht '5\' 8"'$  (1.727 m)   Wt 151 lb 2 oz (68.5 kg)   SpO2 95%   BMI 22.98 kg/m  Wt Readings from Last 3 Encounters:  03/22/22 151 lb 2 oz (68.5 kg)  01/13/22 156 lb (70.8 kg)  01/12/21 161 lb (73 kg)     Health Maintenance Due  Topic Date Due   Medicare Annual Wellness (AWV)  Never done   COVID-19 Vaccine (5 - Pfizer risk series) 12/04/2020   TETANUS/TDAP  12/26/2021    There are no preventive care reminders to display for this patient.  No results found for: "TSH" Lab Results  Component Value Date   WBC 7.0 01/13/2022   HGB 13.7 01/13/2022   HCT 41.4 01/13/2022   MCV 99.2 01/13/2022   PLT 193.0 01/13/2022   Lab Results  Component Value Date   NA 137 01/13/2022   K 4.3 01/13/2022   CO2 27 01/13/2022   GLUCOSE 102 (H) 01/13/2022   BUN 17 01/13/2022   CREATININE 1.05 01/13/2022   BILITOT 1.0 01/13/2022   ALKPHOS 51 01/13/2022   AST 21 01/13/2022   ALT 14 01/13/2022   PROT 7.0 01/13/2022   ALBUMIN 4.0 01/13/2022   CALCIUM 9.5 01/13/2022   ANIONGAP 9 01/05/2018   GFR 67.13 01/13/2022   Lab Results  Component Value Date   CHOL 97 01/13/2022   Lab Results  Component Value Date   HDL 25.60 (L) 01/13/2022   Lab Results  Component Value Date   LDLCALC 60 01/13/2022   Lab Results  Component Value Date   TRIG 58.0 01/13/2022   Lab Results   Component Value Date   CHOLHDL 4 01/13/2022   Lab Results  Component Value Date   HGBA1C 6.1 01/13/2022      Assessment & Plan:   Problem List Items Addressed This Visit       Genitourinary   Malignant neoplasm of prostate [facility])    continue f/u with urology as scheduled.         Other   Hyperlipidemia    Continue Lipitor 10 mg once daily. Work on low cholesterol diet and exercise as tolerated        Prediabetes    Reiterated to work on diabetic diet and exercise as tolerated.        No orders of the defined types were placed in this encounter.   Follow-up: Return in about 3 months (around 06/22/2022) for f/u cholesterol.    Eugenia Pancoast, FNP

## 2022-03-22 NOTE — Patient Instructions (Addendum)
  Start 1000 mcg once daily of vitamin D3 (over the counter)   Start vitamin b12 1000 mcg once daily.  Complete your b12 injection series.    ------------------------------------ You are due for tetanus vaccination. Call pharmacy to get this scheduled.    ------------------------------------   Welcome to our clinic, I am happy to have you as my new patient. I am excited to continue on this healthcare journey with you.  Stop by the lab prior to leaving today. I will notify you of your results once received.   Please keep in mind Any my chart messages you send have up to a three business day turnaround for a response.  Phone calls may take up to a one full business day turnaround for a  response.   If you need a medication refill I recommend you request it through the pharmacy as this is easiest for [place] rather than sending a message and or phone call.   Due to recent changes in healthcare laws, you may see results of your imaging and/or laboratory studies on MyChart before I have had a chance to review them.  I understand that in some cases there may be results that are confusing or concerning to you. Please understand that not all results are received at the same time and often I may need to interpret multiple results in order to provide you with the best plan of care or course of treatment. Therefore, I ask that you please give me 2 business days to thoroughly review all your results before contacting my office for clarification. Should we see a critical lab result, you will be contacted sooner.   It was a pleasure seeing you today! Please do not hesitate to reach out with any questions and or concerns.  Regards,   Eugenia Pancoast FNP-C

## 2022-03-22 NOTE — Progress Notes (Deleted)
tr

## 2022-03-23 NOTE — Assessment & Plan Note (Signed)
Reiterated to work on diabetic diet and exercise as tolerated.

## 2022-03-23 NOTE — Assessment & Plan Note (Signed)
continue f/u with urology as scheduled.

## 2022-03-23 NOTE — Assessment & Plan Note (Signed)
Continue Lipitor 10 mg once daily. Work on low cholesterol diet and exercise as tolerated

## 2022-04-06 ENCOUNTER — Ambulatory Visit (INDEPENDENT_AMBULATORY_CARE_PROVIDER_SITE_OTHER): Payer: Medicare HMO

## 2022-04-06 DIAGNOSIS — E538 Deficiency of other specified B group vitamins: Secondary | ICD-10-CM

## 2022-04-06 MED ORDER — CYANOCOBALAMIN 1000 MCG/ML IJ SOLN
1000.0000 ug | Freq: Once | INTRAMUSCULAR | Status: AC
  Administered 2022-04-06: 1000 ug via INTRAMUSCULAR

## 2022-04-06 NOTE — Progress Notes (Signed)
Per orders of Tabitha Dugal FNP, injection of Vitamin B 12 right deltoid given by Ozzie Hoyle. Patient tolerated injection well.

## 2022-05-03 DIAGNOSIS — C61 Malignant neoplasm of prostate: Secondary | ICD-10-CM | POA: Diagnosis not present

## 2022-05-03 DIAGNOSIS — R310 Gross hematuria: Secondary | ICD-10-CM | POA: Diagnosis not present

## 2022-05-05 ENCOUNTER — Telehealth: Payer: Self-pay | Admitting: Family

## 2022-05-05 NOTE — Telephone Encounter (Signed)
Pt reports that he needs to be seen for two issues.  He feels like he's  pulled a muscle in his back a few days ago and has noticed some blood in his urine that he reported to his oncologist.  The oncologist said that it would be best for him to make an appointment to see PCP.  Appointment made with Eugenia Pancoast, NP 05/07/22 @ 10:40a

## 2022-05-05 NOTE — Telephone Encounter (Signed)
Patient called in and stated that his caner doctor Dr. Franchot Gallo wants him to set up an appointment with Tabitha. He stated that she will need to call over to speak with him to see what the appointment is for. Thank you!

## 2022-05-07 ENCOUNTER — Encounter: Payer: Self-pay | Admitting: Family

## 2022-05-07 ENCOUNTER — Telehealth: Payer: Self-pay

## 2022-05-07 ENCOUNTER — Ambulatory Visit (INDEPENDENT_AMBULATORY_CARE_PROVIDER_SITE_OTHER): Payer: Medicare HMO | Admitting: Family

## 2022-05-07 VITALS — BP 116/60 | HR 61 | Temp 97.6°F | Resp 16 | Ht 68.0 in | Wt 148.5 lb

## 2022-05-07 DIAGNOSIS — R319 Hematuria, unspecified: Secondary | ICD-10-CM | POA: Diagnosis not present

## 2022-05-07 DIAGNOSIS — R3915 Urgency of urination: Secondary | ICD-10-CM | POA: Insufficient documentation

## 2022-05-07 DIAGNOSIS — G5701 Lesion of sciatic nerve, right lower limb: Secondary | ICD-10-CM

## 2022-05-07 LAB — URINALYSIS, ROUTINE W REFLEX MICROSCOPIC
Bilirubin Urine: NEGATIVE
Ketones, ur: NEGATIVE
Leukocytes,Ua: NEGATIVE
Nitrite: NEGATIVE
Specific Gravity, Urine: 1.025 (ref 1.000–1.030)
Total Protein, Urine: NEGATIVE
Urine Glucose: NEGATIVE
Urobilinogen, UA: 0.2 (ref 0.0–1.0)
pH: 5.5 (ref 5.0–8.0)

## 2022-05-07 LAB — POC URINALSYSI DIPSTICK (AUTOMATED)
Bilirubin, UA: NEGATIVE
Glucose, UA: NEGATIVE
Ketones, UA: NEGATIVE
Leukocytes, UA: NEGATIVE
Nitrite, UA: NEGATIVE
Protein, UA: POSITIVE — AB
Spec Grav, UA: 1.02 (ref 1.010–1.025)
Urobilinogen, UA: 0.2 E.U./dL
pH, UA: 5 (ref 5.0–8.0)

## 2022-05-07 NOTE — Telephone Encounter (Signed)
-----   Message from Eugenia Pancoast, [place] sent at 05/07/2022 11:22 AM EST ----- Regarding: Dr. Please call Dr. Eulogio Ditch   Pt states that the Dr said he wanted to speak with me, I did send him a message this am but I am sure he is clinic. Pt seemed to say he gave him a urine sample, so wanted to see if they could relay any message if necessary? I am going to order a urine otherwise bc he does seem symptomatic at current.

## 2022-05-07 NOTE — Progress Notes (Signed)
Slight blood and protein in urine. Pending UA

## 2022-05-07 NOTE — Progress Notes (Unsigned)
Established Patient Office Visit  Subjective:  Patient ID: Lawrence Wong, male    DOB: 1940-07-12  Age: 81 y.o. MRN: [id number]  CC:  Chief Complaint  Patient presents with  . Hematuria  . Back Pain    Pulled muscle in back possibly. In hip area but moves.    HPI [name] is here today with concerns.   In the past one week he noticed a small amount of blood in the urine.  He spoke with Dr. Diona Fanti his oncology urologist, and pt states this was 'ok'  Last time he saw blood in urine was about 3 or four days ago. Increase in urinary frequency. He also c/o back pain, he thinks he may have pulled a muscle. When he goes to bed around 2-3 am he has tendency to have to pee at night as well which has gotten worse in the last few days.he does note urinary urgency, and has had one night where he didn't make it to the bathroom before he could pee.   He does have a f/u appointment with Dr. Diona Fanti Monday 05/10/22  The back pain is moving. All day yesterday felt pain on right lower buttock that hurt to even touch. Hard to walk as well due to the pain. He is using right sided cane to walk, which he does usually use on a regular basis. Back improved slightly today. Denies flank pain.   No lower abdominal pain , no nausea, no vomiting.    Past Medical History:  Diagnosis Date  . Allergy   . Arthritis   . Asthma   . Chicken pox   . Chronic kidney disease   . Colon polyps   . Diabetes mellitus without complication ([place])   . GERD (gastroesophageal reflux disease)   . Heart murmur   . Hyperlipidemia   . Hypertension   . Prostate cancer ([place])   . Syphilis     Past Surgical History:  Procedure Laterality Date  . INGUINAL LYMPH NODE BIOPSY    . PROSTATE BIOPSY      Family History  Problem Relation Age of Onset  . Heart disease Mother   . Cancer Neg Hx   . Breast cancer Neg Hx   . Prostate cancer Neg Hx   . Colon cancer Neg Hx   . Pancreatic cancer Neg Hx     Social  History   Socioeconomic History  . Marital status: Single    Spouse name: Not on file  . Number of children: 1  . Years of education: Not on file  . Highest education level: Not on file  Occupational History  . Occupation: retired  . Occupation: retired  Tobacco Use  . Smoking status: Former    Packs/day: 1.00    Years: 40.00    Total pack years: 40.00    Types: Cigarettes    Quit date: 06/01/1983    Years since quitting: 38.9  . Smokeless tobacco: Never  . Tobacco comments:    States, "I have been around cigarettes my entire lift. 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 Vaping Use: Never used  Substance and Sexual Activity  . Alcohol use: Not Currently    Comment: quit years ago  . Drug use: Never  . Sexual activity: Not Currently  Other Topics Concern  . 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 Difficulty of Paying Living Expenses: Not hard at all  Food Insecurity: No Food Insecurity (02/02/2022)   Hunger Vital Sign   . Worried About Charity fundraiser in the Last Year: Never true   . Ran Out of Food in the Last Year: Never true  Transportation Needs: No Transportation Needs (02/02/2022)   PRAPARE - Transportation   . Lack of Transportation (Medical): No   . Lack of Transportation (Non-Medical): No  Physical Activity:  Inactive (01/12/2022)   Exercise Vital Sign   . Days of Exercise per Week: 0 days   . Minutes of Exercise per Session: 0 min  Stress: No Stress Concern Present (01/12/2022)   [place]   . Feeling of Stress : Not at all  Social Connections: Unknown (01/12/2022)   Social Connection and Isolation Panel [NHANES]   . Frequency of Communication with Friends and Family: More than three times a week   . Frequency of Social Gatherings with Friends and Family: Once a week   . Attends Religious Services: More than 4 times per year   . Active Member of Clubs or Organizations: No   . Attends Archivist Meetings: More than 4 times per year   . Marital Status: Not on file  Intimate Partner Violence: Not At Risk (01/12/2022)   Humiliation, Afraid, Rape, and Kick questionnaire   . Fear of Current or Ex-Partner: No   . Emotionally Abused: No   . Physically Abused: No   . Sexually Abused: No    Outpatient Medications Prior to Visit  Medication Sig Dispense Refill  . atorvastatin (LIPITOR) 10 MG tablet Take 1 tablet (10 mg total) by mouth daily. 90 tablet 3  . Cholecalciferol (VITAMIN D-3 PO) Take by mouth.    . cyanocobalamin (VITAMIN B12) 1000 MCG tablet Take 1,000 mcg by mouth daily.     No facility-administered medications prior to visit.    No Known Allergies      Objective:    Physical Exam Constitutional:      Appearance: Normal appearance.  Pulmonary:     Effort: Pulmonary effort is normal.  Neurological:     General: No focal deficit present.     Mental Status: He is alert and oriented to person, place, and time. Mental status is at baseline.  Psychiatric:        Mood and Affect: Mood normal.        Behavior: Behavior normal.        Thought Content: Thought content normal.        Judgment: Judgment normal.    BP 116/60   Pulse 61   Temp 97.6 F (36.4 C)   Resp 16   Ht 5'  8" (1.727 m)   Wt 148 lb  8 oz (67.4 kg)   SpO2 96%   BMI 22.58 kg/m  Wt Readings from Last 3 Encounters:  05/07/22 148 lb 8 oz (67.4 kg)  03/22/22 151 lb 2 oz (68.5 kg)  01/13/22 156 lb (70.8 kg)     Health Maintenance Due  Topic Date Due  . COVID-19 Vaccine (5 - 2023-24 season) 01/29/2022    There are no preventive care reminders to display for this patient.  No results found for: "TSH" Lab Results  Component Value Date   WBC 7.0 01/13/2022   HGB 13.7 01/13/2022   HCT 41.4 01/13/2022   MCV 99.2 01/13/2022   PLT 193.0 01/13/2022   Lab Results  Component Value Date   NA 137 01/13/2022   K 4.3 01/13/2022   CO2 27 01/13/2022   GLUCOSE 102 (H) 01/13/2022   BUN 17 01/13/2022   CREATININE 1.05 01/13/2022   BILITOT 1.0 01/13/2022   ALKPHOS 51 01/13/2022   AST 21 01/13/2022   ALT 14 01/13/2022   PROT 7.0 01/13/2022   ALBUMIN 4.0 01/13/2022   CALCIUM 9.5 01/13/2022   ANIONGAP 9 01/05/2018   GFR 67.13 01/13/2022   Lab Results  Component Value Date   HGBA1C 6.1 01/13/2022      Assessment & Plan:   Problem List Items Addressed This Visit   None   No orders of the defined types were placed in this encounter.   Follow-up: No follow-ups on file.    Eugenia Pancoast, FNP

## 2022-05-07 NOTE — Telephone Encounter (Signed)
Sent message to dr. Diona Fanti, will await reply.   Wills see pt at 1040 am this am.

## 2022-05-07 NOTE — Patient Instructions (Addendum)
  If the lower right buttock muscles starts to hut again, use lidocaine over the counter 4% pain as well as apply heat to the site.  Massage it as well.   You can take tylenol as well as needed for the pain.  You can also consider getting some over the counter voltaren gel (diclofenac gel)  We are going to make sure you do not have a UTI either, will test urine today.    Regards,   Eugenia Pancoast FNP-C

## 2022-05-07 NOTE — Progress Notes (Signed)
Pending culture.

## 2022-05-07 NOTE — Telephone Encounter (Signed)
Called the dr and they did a urine culture and urinalysis. The results are back from the urinalysis but not the culture. Pt has an appt on Dec 11th with the dr and had labs on Dec 4th. Nurse did say she is not sure about the dr wanting to speak to [place].

## 2022-05-08 DIAGNOSIS — G5701 Lesion of sciatic nerve, right lower limb: Secondary | ICD-10-CM | POA: Insufficient documentation

## 2022-05-08 LAB — URINE CULTURE
MICRO NUMBER:: 14290197
Result:: NO GROWTH
SPECIMEN QUALITY:: ADEQUATE

## 2022-05-08 NOTE — Assessment & Plan Note (Signed)
F/u with urology

## 2022-05-08 NOTE — Assessment & Plan Note (Signed)
poct urine dip in office Urine culture ordered pending results  

## 2022-05-08 NOTE — Assessment & Plan Note (Signed)
Handout given on exercises Heat/massage to site Voltaren gel to site  Tylenol prn

## 2022-05-09 NOTE — Progress Notes (Signed)
Urine culture negative for infection. F/u with urology in regards to blood in urine, although likely expected.   Has there been improvement with sciatica?

## 2022-05-10 DIAGNOSIS — C61 Malignant neoplasm of prostate: Secondary | ICD-10-CM | POA: Diagnosis not present

## 2022-05-11 ENCOUNTER — Telehealth: Payer: Self-pay | Admitting: Primary Care

## 2022-05-11 NOTE — Telephone Encounter (Signed)
[place] Night - Client Nonclinical Telephone Record  [facility] Client [facility] Night - Client Client Site [place] - Night Provider AA - PHYSICIAN, Verita Schneiders- MD Contact Type Call Who Is Calling Patient / Member / Family / Caregiver Caller Name Lawrence Wong Caller Phone Number [phone] Call Type Message Only Information Provided Reason for Call Returning a Call from the Office Initial Comment The caller states he is returning a call from the office. Additional Comment Office hours provided. Disp. Time Disposition Final User 05/10/2022 5:30:38 PM General Information Provided Yes Quitman Livings Call Closed By: Quitman Livings Transaction Date/Time: 05/10/2022 5:27:47 PM (ET

## 2022-05-11 NOTE — Telephone Encounter (Signed)
Spoke to patient by telephone and lab results were given. See result notes.

## 2022-05-22 DIAGNOSIS — R000 Tachycardia, unspecified: Secondary | ICD-10-CM | POA: Diagnosis not present

## 2022-05-22 DIAGNOSIS — I639 Cerebral infarction, unspecified: Secondary | ICD-10-CM | POA: Insufficient documentation

## 2022-05-22 DIAGNOSIS — R410 Disorientation, unspecified: Secondary | ICD-10-CM | POA: Diagnosis not present

## 2022-05-22 DIAGNOSIS — I39 Endocarditis and heart valve disorders in diseases classified elsewhere: Secondary | ICD-10-CM | POA: Diagnosis not present

## 2022-05-22 DIAGNOSIS — R279 Unspecified lack of coordination: Secondary | ICD-10-CM | POA: Diagnosis not present

## 2022-05-22 DIAGNOSIS — R7881 Bacteremia: Secondary | ICD-10-CM | POA: Diagnosis not present

## 2022-05-22 DIAGNOSIS — I340 Nonrheumatic mitral (valve) insufficiency: Secondary | ICD-10-CM | POA: Diagnosis not present

## 2022-05-22 DIAGNOSIS — I69318 Other symptoms and signs involving cognitive functions following cerebral infarction: Secondary | ICD-10-CM | POA: Diagnosis not present

## 2022-05-22 DIAGNOSIS — I330 Acute and subacute infective endocarditis: Secondary | ICD-10-CM | POA: Diagnosis not present

## 2022-05-22 DIAGNOSIS — Z7409 Other reduced mobility: Secondary | ICD-10-CM | POA: Diagnosis not present

## 2022-05-22 DIAGNOSIS — Z789 Other specified health status: Secondary | ICD-10-CM | POA: Diagnosis not present

## 2022-05-22 DIAGNOSIS — I69320 Aphasia following cerebral infarction: Secondary | ICD-10-CM | POA: Diagnosis not present

## 2022-05-22 DIAGNOSIS — Z95828 Presence of other vascular implants and grafts: Secondary | ICD-10-CM | POA: Diagnosis not present

## 2022-05-22 DIAGNOSIS — R4189 Other symptoms and signs involving cognitive functions and awareness: Secondary | ICD-10-CM | POA: Diagnosis not present

## 2022-05-22 DIAGNOSIS — I6523 Occlusion and stenosis of bilateral carotid arteries: Secondary | ICD-10-CM | POA: Diagnosis not present

## 2022-05-22 DIAGNOSIS — I10 Essential (primary) hypertension: Secondary | ICD-10-CM | POA: Diagnosis not present

## 2022-05-22 DIAGNOSIS — E119 Type 2 diabetes mellitus without complications: Secondary | ICD-10-CM | POA: Insufficient documentation

## 2022-05-22 DIAGNOSIS — Z20822 Contact with and (suspected) exposure to covid-19: Secondary | ICD-10-CM | POA: Diagnosis not present

## 2022-05-22 DIAGNOSIS — B954 Other streptococcus as the cause of diseases classified elsewhere: Secondary | ICD-10-CM | POA: Diagnosis not present

## 2022-05-22 DIAGNOSIS — J341 Cyst and mucocele of nose and nasal sinus: Secondary | ICD-10-CM | POA: Diagnosis not present

## 2022-05-22 DIAGNOSIS — R778 Other specified abnormalities of plasma proteins: Secondary | ICD-10-CM | POA: Diagnosis not present

## 2022-05-22 DIAGNOSIS — I700 Atherosclerosis of aorta: Secondary | ICD-10-CM | POA: Diagnosis not present

## 2022-05-22 DIAGNOSIS — R4701 Aphasia: Secondary | ICD-10-CM | POA: Diagnosis not present

## 2022-05-22 DIAGNOSIS — Z452 Encounter for adjustment and management of vascular access device: Secondary | ICD-10-CM | POA: Diagnosis not present

## 2022-05-22 DIAGNOSIS — Z880 Allergy status to penicillin: Secondary | ICD-10-CM | POA: Diagnosis not present

## 2022-05-22 DIAGNOSIS — R4182 Altered mental status, unspecified: Secondary | ICD-10-CM | POA: Diagnosis not present

## 2022-05-22 DIAGNOSIS — I63512 Cerebral infarction due to unspecified occlusion or stenosis of left middle cerebral artery: Secondary | ICD-10-CM | POA: Diagnosis not present

## 2022-05-22 DIAGNOSIS — R29715 NIHSS score 15: Secondary | ICD-10-CM | POA: Diagnosis not present

## 2022-05-22 DIAGNOSIS — I3489 Other nonrheumatic mitral valve disorders: Secondary | ICD-10-CM | POA: Diagnosis not present

## 2022-05-22 DIAGNOSIS — I63412 Cerebral infarction due to embolism of left middle cerebral artery: Secondary | ICD-10-CM | POA: Diagnosis not present

## 2022-05-22 DIAGNOSIS — M6281 Muscle weakness (generalized): Secondary | ICD-10-CM | POA: Diagnosis not present

## 2022-05-22 DIAGNOSIS — R2689 Other abnormalities of gait and mobility: Secondary | ICD-10-CM | POA: Diagnosis not present

## 2022-05-22 DIAGNOSIS — R1312 Dysphagia, oropharyngeal phase: Secondary | ICD-10-CM | POA: Diagnosis not present

## 2022-05-22 DIAGNOSIS — D649 Anemia, unspecified: Secondary | ICD-10-CM | POA: Diagnosis not present

## 2022-05-22 DIAGNOSIS — R5381 Other malaise: Secondary | ICD-10-CM | POA: Diagnosis not present

## 2022-05-22 DIAGNOSIS — C61 Malignant neoplasm of prostate: Secondary | ICD-10-CM | POA: Diagnosis not present

## 2022-05-22 DIAGNOSIS — Z7984 Long term (current) use of oral hypoglycemic drugs: Secondary | ICD-10-CM | POA: Diagnosis not present

## 2022-05-22 DIAGNOSIS — Z7982 Long term (current) use of aspirin: Secondary | ICD-10-CM | POA: Diagnosis not present

## 2022-05-22 DIAGNOSIS — Z79899 Other long term (current) drug therapy: Secondary | ICD-10-CM | POA: Diagnosis not present

## 2022-05-22 DIAGNOSIS — K029 Dental caries, unspecified: Secondary | ICD-10-CM | POA: Diagnosis not present

## 2022-05-22 DIAGNOSIS — Z743 Need for continuous supervision: Secondary | ICD-10-CM | POA: Diagnosis not present

## 2022-05-22 DIAGNOSIS — Z7901 Long term (current) use of anticoagulants: Secondary | ICD-10-CM | POA: Diagnosis not present

## 2022-05-22 DIAGNOSIS — E1159 Type 2 diabetes mellitus with other circulatory complications: Secondary | ICD-10-CM | POA: Diagnosis not present

## 2022-05-22 DIAGNOSIS — R509 Fever, unspecified: Secondary | ICD-10-CM | POA: Diagnosis not present

## 2022-05-22 DIAGNOSIS — I6389 Other cerebral infarction: Secondary | ICD-10-CM | POA: Diagnosis not present

## 2022-05-23 DIAGNOSIS — J341 Cyst and mucocele of nose and nasal sinus: Secondary | ICD-10-CM | POA: Diagnosis not present

## 2022-05-23 DIAGNOSIS — I639 Cerebral infarction, unspecified: Secondary | ICD-10-CM | POA: Diagnosis not present

## 2022-05-23 DIAGNOSIS — R509 Fever, unspecified: Secondary | ICD-10-CM | POA: Diagnosis not present

## 2022-05-23 DIAGNOSIS — I63512 Cerebral infarction due to unspecified occlusion or stenosis of left middle cerebral artery: Secondary | ICD-10-CM | POA: Diagnosis not present

## 2022-05-23 DIAGNOSIS — D649 Anemia, unspecified: Secondary | ICD-10-CM | POA: Diagnosis not present

## 2022-05-23 DIAGNOSIS — E119 Type 2 diabetes mellitus without complications: Secondary | ICD-10-CM | POA: Diagnosis not present

## 2022-05-23 DIAGNOSIS — R4182 Altered mental status, unspecified: Secondary | ICD-10-CM | POA: Diagnosis not present

## 2022-05-24 DIAGNOSIS — E119 Type 2 diabetes mellitus without complications: Secondary | ICD-10-CM | POA: Diagnosis not present

## 2022-05-24 DIAGNOSIS — J341 Cyst and mucocele of nose and nasal sinus: Secondary | ICD-10-CM | POA: Diagnosis not present

## 2022-05-24 DIAGNOSIS — I63512 Cerebral infarction due to unspecified occlusion or stenosis of left middle cerebral artery: Secondary | ICD-10-CM | POA: Diagnosis not present

## 2022-05-24 DIAGNOSIS — D649 Anemia, unspecified: Secondary | ICD-10-CM | POA: Diagnosis not present

## 2022-05-25 DIAGNOSIS — I63512 Cerebral infarction due to unspecified occlusion or stenosis of left middle cerebral artery: Secondary | ICD-10-CM | POA: Diagnosis not present

## 2022-05-25 DIAGNOSIS — E119 Type 2 diabetes mellitus without complications: Secondary | ICD-10-CM | POA: Diagnosis not present

## 2022-05-25 DIAGNOSIS — R509 Fever, unspecified: Secondary | ICD-10-CM | POA: Diagnosis not present

## 2022-05-25 DIAGNOSIS — I3489 Other nonrheumatic mitral valve disorders: Secondary | ICD-10-CM | POA: Diagnosis not present

## 2022-05-25 DIAGNOSIS — I6389 Other cerebral infarction: Secondary | ICD-10-CM | POA: Diagnosis not present

## 2022-05-25 DIAGNOSIS — I639 Cerebral infarction, unspecified: Secondary | ICD-10-CM | POA: Diagnosis not present

## 2022-05-26 DIAGNOSIS — I639 Cerebral infarction, unspecified: Secondary | ICD-10-CM | POA: Diagnosis not present

## 2022-05-26 DIAGNOSIS — I340 Nonrheumatic mitral (valve) insufficiency: Secondary | ICD-10-CM | POA: Diagnosis not present

## 2022-05-26 DIAGNOSIS — I3489 Other nonrheumatic mitral valve disorders: Secondary | ICD-10-CM | POA: Diagnosis not present

## 2022-05-26 DIAGNOSIS — I63512 Cerebral infarction due to unspecified occlusion or stenosis of left middle cerebral artery: Secondary | ICD-10-CM | POA: Diagnosis not present

## 2022-05-26 DIAGNOSIS — R509 Fever, unspecified: Secondary | ICD-10-CM | POA: Diagnosis not present

## 2022-05-26 DIAGNOSIS — E1159 Type 2 diabetes mellitus with other circulatory complications: Secondary | ICD-10-CM | POA: Diagnosis not present

## 2022-05-26 DIAGNOSIS — I330 Acute and subacute infective endocarditis: Secondary | ICD-10-CM | POA: Diagnosis not present

## 2022-05-27 DIAGNOSIS — E119 Type 2 diabetes mellitus without complications: Secondary | ICD-10-CM | POA: Diagnosis not present

## 2022-05-27 DIAGNOSIS — I330 Acute and subacute infective endocarditis: Secondary | ICD-10-CM | POA: Diagnosis not present

## 2022-05-27 DIAGNOSIS — I39 Endocarditis and heart valve disorders in diseases classified elsewhere: Secondary | ICD-10-CM | POA: Diagnosis not present

## 2022-05-27 DIAGNOSIS — R778 Other specified abnormalities of plasma proteins: Secondary | ICD-10-CM | POA: Diagnosis not present

## 2022-05-27 DIAGNOSIS — I63512 Cerebral infarction due to unspecified occlusion or stenosis of left middle cerebral artery: Secondary | ICD-10-CM | POA: Diagnosis not present

## 2022-05-27 DIAGNOSIS — I340 Nonrheumatic mitral (valve) insufficiency: Secondary | ICD-10-CM | POA: Diagnosis not present

## 2022-05-28 DIAGNOSIS — I330 Acute and subacute infective endocarditis: Secondary | ICD-10-CM | POA: Diagnosis not present

## 2022-05-28 DIAGNOSIS — I63512 Cerebral infarction due to unspecified occlusion or stenosis of left middle cerebral artery: Secondary | ICD-10-CM | POA: Diagnosis not present

## 2022-05-28 DIAGNOSIS — I340 Nonrheumatic mitral (valve) insufficiency: Secondary | ICD-10-CM | POA: Diagnosis not present

## 2022-05-28 DIAGNOSIS — R7881 Bacteremia: Secondary | ICD-10-CM | POA: Diagnosis not present

## 2022-05-29 DIAGNOSIS — R7881 Bacteremia: Secondary | ICD-10-CM | POA: Diagnosis not present

## 2022-05-29 DIAGNOSIS — I639 Cerebral infarction, unspecified: Secondary | ICD-10-CM | POA: Diagnosis not present

## 2022-05-29 DIAGNOSIS — I340 Nonrheumatic mitral (valve) insufficiency: Secondary | ICD-10-CM | POA: Diagnosis not present

## 2022-05-29 DIAGNOSIS — I63512 Cerebral infarction due to unspecified occlusion or stenosis of left middle cerebral artery: Secondary | ICD-10-CM | POA: Diagnosis not present

## 2022-05-29 DIAGNOSIS — I330 Acute and subacute infective endocarditis: Secondary | ICD-10-CM | POA: Diagnosis not present

## 2022-05-30 DIAGNOSIS — I340 Nonrheumatic mitral (valve) insufficiency: Secondary | ICD-10-CM | POA: Diagnosis not present

## 2022-05-30 DIAGNOSIS — R7881 Bacteremia: Secondary | ICD-10-CM | POA: Diagnosis not present

## 2022-05-30 DIAGNOSIS — I330 Acute and subacute infective endocarditis: Secondary | ICD-10-CM | POA: Diagnosis not present

## 2022-05-30 DIAGNOSIS — I63512 Cerebral infarction due to unspecified occlusion or stenosis of left middle cerebral artery: Secondary | ICD-10-CM | POA: Diagnosis not present

## 2022-05-31 DIAGNOSIS — R7881 Bacteremia: Secondary | ICD-10-CM | POA: Diagnosis not present

## 2022-05-31 DIAGNOSIS — I63512 Cerebral infarction due to unspecified occlusion or stenosis of left middle cerebral artery: Secondary | ICD-10-CM | POA: Diagnosis not present

## 2022-05-31 DIAGNOSIS — I330 Acute and subacute infective endocarditis: Secondary | ICD-10-CM | POA: Diagnosis not present

## 2022-06-01 DIAGNOSIS — R7881 Bacteremia: Secondary | ICD-10-CM | POA: Diagnosis not present

## 2022-06-01 DIAGNOSIS — Z7901 Long term (current) use of anticoagulants: Secondary | ICD-10-CM | POA: Diagnosis not present

## 2022-06-01 DIAGNOSIS — Z79899 Other long term (current) drug therapy: Secondary | ICD-10-CM | POA: Diagnosis not present

## 2022-06-01 DIAGNOSIS — K029 Dental caries, unspecified: Secondary | ICD-10-CM | POA: Diagnosis not present

## 2022-06-01 DIAGNOSIS — Z789 Other specified health status: Secondary | ICD-10-CM | POA: Diagnosis not present

## 2022-06-01 DIAGNOSIS — B954 Other streptococcus as the cause of diseases classified elsewhere: Secondary | ICD-10-CM | POA: Diagnosis not present

## 2022-06-01 DIAGNOSIS — Z95828 Presence of other vascular implants and grafts: Secondary | ICD-10-CM | POA: Diagnosis not present

## 2022-06-01 DIAGNOSIS — I69320 Aphasia following cerebral infarction: Secondary | ICD-10-CM | POA: Diagnosis not present

## 2022-06-01 DIAGNOSIS — I63412 Cerebral infarction due to embolism of left middle cerebral artery: Secondary | ICD-10-CM | POA: Diagnosis not present

## 2022-06-01 DIAGNOSIS — R4189 Other symptoms and signs involving cognitive functions and awareness: Secondary | ICD-10-CM | POA: Diagnosis not present

## 2022-06-01 DIAGNOSIS — Z7409 Other reduced mobility: Secondary | ICD-10-CM | POA: Diagnosis not present

## 2022-06-01 DIAGNOSIS — I330 Acute and subacute infective endocarditis: Secondary | ICD-10-CM | POA: Diagnosis not present

## 2022-06-01 DIAGNOSIS — Z7982 Long term (current) use of aspirin: Secondary | ICD-10-CM | POA: Diagnosis not present

## 2022-06-02 DIAGNOSIS — I330 Acute and subacute infective endocarditis: Secondary | ICD-10-CM | POA: Diagnosis not present

## 2022-06-02 DIAGNOSIS — R7881 Bacteremia: Secondary | ICD-10-CM | POA: Diagnosis not present

## 2022-06-02 DIAGNOSIS — I340 Nonrheumatic mitral (valve) insufficiency: Secondary | ICD-10-CM | POA: Diagnosis not present

## 2022-06-02 DIAGNOSIS — B954 Other streptococcus as the cause of diseases classified elsewhere: Secondary | ICD-10-CM | POA: Diagnosis not present

## 2022-06-03 DIAGNOSIS — R7881 Bacteremia: Secondary | ICD-10-CM | POA: Diagnosis not present

## 2022-06-03 DIAGNOSIS — B954 Other streptococcus as the cause of diseases classified elsewhere: Secondary | ICD-10-CM | POA: Diagnosis not present

## 2022-06-03 DIAGNOSIS — I340 Nonrheumatic mitral (valve) insufficiency: Secondary | ICD-10-CM | POA: Diagnosis not present

## 2022-06-03 DIAGNOSIS — I330 Acute and subacute infective endocarditis: Secondary | ICD-10-CM | POA: Diagnosis not present

## 2022-06-04 DIAGNOSIS — B954 Other streptococcus as the cause of diseases classified elsewhere: Secondary | ICD-10-CM | POA: Diagnosis not present

## 2022-06-04 DIAGNOSIS — I340 Nonrheumatic mitral (valve) insufficiency: Secondary | ICD-10-CM | POA: Diagnosis not present

## 2022-06-04 DIAGNOSIS — R7881 Bacteremia: Secondary | ICD-10-CM | POA: Diagnosis not present

## 2022-06-04 DIAGNOSIS — I330 Acute and subacute infective endocarditis: Secondary | ICD-10-CM | POA: Diagnosis not present

## 2022-06-05 DIAGNOSIS — B954 Other streptococcus as the cause of diseases classified elsewhere: Secondary | ICD-10-CM | POA: Diagnosis not present

## 2022-06-05 DIAGNOSIS — R7881 Bacteremia: Secondary | ICD-10-CM | POA: Diagnosis not present

## 2022-06-05 DIAGNOSIS — I340 Nonrheumatic mitral (valve) insufficiency: Secondary | ICD-10-CM | POA: Diagnosis not present

## 2022-06-05 DIAGNOSIS — I330 Acute and subacute infective endocarditis: Secondary | ICD-10-CM | POA: Diagnosis not present

## 2022-06-06 DIAGNOSIS — I330 Acute and subacute infective endocarditis: Secondary | ICD-10-CM | POA: Diagnosis not present

## 2022-06-06 DIAGNOSIS — I340 Nonrheumatic mitral (valve) insufficiency: Secondary | ICD-10-CM | POA: Diagnosis not present

## 2022-06-06 DIAGNOSIS — B954 Other streptococcus as the cause of diseases classified elsewhere: Secondary | ICD-10-CM | POA: Diagnosis not present

## 2022-06-06 DIAGNOSIS — R7881 Bacteremia: Secondary | ICD-10-CM | POA: Diagnosis not present

## 2022-06-07 DIAGNOSIS — R7881 Bacteremia: Secondary | ICD-10-CM | POA: Diagnosis not present

## 2022-06-07 DIAGNOSIS — B954 Other streptococcus as the cause of diseases classified elsewhere: Secondary | ICD-10-CM | POA: Diagnosis not present

## 2022-06-07 DIAGNOSIS — I340 Nonrheumatic mitral (valve) insufficiency: Secondary | ICD-10-CM | POA: Diagnosis not present

## 2022-06-07 DIAGNOSIS — I330 Acute and subacute infective endocarditis: Secondary | ICD-10-CM | POA: Diagnosis not present

## 2022-06-08 DIAGNOSIS — B954 Other streptococcus as the cause of diseases classified elsewhere: Secondary | ICD-10-CM | POA: Diagnosis not present

## 2022-06-08 DIAGNOSIS — R7881 Bacteremia: Secondary | ICD-10-CM | POA: Diagnosis not present

## 2022-06-08 DIAGNOSIS — Z452 Encounter for adjustment and management of vascular access device: Secondary | ICD-10-CM | POA: Diagnosis not present

## 2022-06-08 DIAGNOSIS — I330 Acute and subacute infective endocarditis: Secondary | ICD-10-CM | POA: Diagnosis not present

## 2022-06-08 DIAGNOSIS — I340 Nonrheumatic mitral (valve) insufficiency: Secondary | ICD-10-CM | POA: Diagnosis not present

## 2022-06-09 DIAGNOSIS — I340 Nonrheumatic mitral (valve) insufficiency: Secondary | ICD-10-CM | POA: Diagnosis not present

## 2022-06-09 DIAGNOSIS — B954 Other streptococcus as the cause of diseases classified elsewhere: Secondary | ICD-10-CM | POA: Diagnosis not present

## 2022-06-09 DIAGNOSIS — I330 Acute and subacute infective endocarditis: Secondary | ICD-10-CM | POA: Diagnosis not present

## 2022-06-09 DIAGNOSIS — R7881 Bacteremia: Secondary | ICD-10-CM | POA: Diagnosis not present

## 2022-06-10 DIAGNOSIS — R279 Unspecified lack of coordination: Secondary | ICD-10-CM | POA: Diagnosis not present

## 2022-06-10 DIAGNOSIS — I639 Cerebral infarction, unspecified: Secondary | ICD-10-CM | POA: Diagnosis not present

## 2022-06-10 DIAGNOSIS — I3489 Other nonrheumatic mitral valve disorders: Secondary | ICD-10-CM | POA: Diagnosis not present

## 2022-06-10 DIAGNOSIS — R2689 Other abnormalities of gait and mobility: Secondary | ICD-10-CM | POA: Diagnosis not present

## 2022-06-10 DIAGNOSIS — I69318 Other symptoms and signs involving cognitive functions following cerebral infarction: Secondary | ICD-10-CM | POA: Diagnosis not present

## 2022-06-10 DIAGNOSIS — R1312 Dysphagia, oropharyngeal phase: Secondary | ICD-10-CM | POA: Diagnosis not present

## 2022-06-10 DIAGNOSIS — I63512 Cerebral infarction due to unspecified occlusion or stenosis of left middle cerebral artery: Secondary | ICD-10-CM | POA: Diagnosis not present

## 2022-06-10 DIAGNOSIS — F4320 Adjustment disorder, unspecified: Secondary | ICD-10-CM | POA: Diagnosis not present

## 2022-06-10 DIAGNOSIS — M6281 Muscle weakness (generalized): Secondary | ICD-10-CM | POA: Diagnosis not present

## 2022-06-10 DIAGNOSIS — I340 Nonrheumatic mitral (valve) insufficiency: Secondary | ICD-10-CM | POA: Diagnosis not present

## 2022-06-10 DIAGNOSIS — R69 Illness, unspecified: Secondary | ICD-10-CM | POA: Diagnosis not present

## 2022-06-10 DIAGNOSIS — B954 Other streptococcus as the cause of diseases classified elsewhere: Secondary | ICD-10-CM | POA: Diagnosis not present

## 2022-06-10 DIAGNOSIS — R5381 Other malaise: Secondary | ICD-10-CM | POA: Diagnosis not present

## 2022-06-10 DIAGNOSIS — I330 Acute and subacute infective endocarditis: Secondary | ICD-10-CM | POA: Diagnosis not present

## 2022-06-10 DIAGNOSIS — I69320 Aphasia following cerebral infarction: Secondary | ICD-10-CM | POA: Diagnosis not present

## 2022-06-10 DIAGNOSIS — R7881 Bacteremia: Secondary | ICD-10-CM | POA: Diagnosis not present

## 2022-06-14 DIAGNOSIS — R5381 Other malaise: Secondary | ICD-10-CM | POA: Diagnosis not present

## 2022-06-15 DIAGNOSIS — R5381 Other malaise: Secondary | ICD-10-CM | POA: Diagnosis not present

## 2022-06-16 ENCOUNTER — Other Ambulatory Visit (HOSPITAL_COMMUNITY): Payer: Self-pay | Admitting: Urology

## 2022-06-16 DIAGNOSIS — R9721 Rising PSA following treatment for malignant neoplasm of prostate: Secondary | ICD-10-CM

## 2022-06-17 DIAGNOSIS — R5381 Other malaise: Secondary | ICD-10-CM | POA: Diagnosis not present

## 2022-06-18 DIAGNOSIS — R5381 Other malaise: Secondary | ICD-10-CM | POA: Diagnosis not present

## 2022-06-22 ENCOUNTER — Ambulatory Visit: Payer: Medicare HMO | Admitting: Family

## 2022-06-23 DIAGNOSIS — F4320 Adjustment disorder, unspecified: Secondary | ICD-10-CM | POA: Diagnosis not present

## 2022-06-23 DIAGNOSIS — R69 Illness, unspecified: Secondary | ICD-10-CM | POA: Diagnosis not present

## 2022-06-29 DIAGNOSIS — I3489 Other nonrheumatic mitral valve disorders: Secondary | ICD-10-CM | POA: Diagnosis not present

## 2022-06-29 DIAGNOSIS — I340 Nonrheumatic mitral (valve) insufficiency: Secondary | ICD-10-CM | POA: Diagnosis not present

## 2022-06-29 DIAGNOSIS — B954 Other streptococcus as the cause of diseases classified elsewhere: Secondary | ICD-10-CM | POA: Diagnosis not present

## 2022-06-29 DIAGNOSIS — I639 Cerebral infarction, unspecified: Secondary | ICD-10-CM | POA: Diagnosis not present

## 2022-07-01 DIAGNOSIS — I330 Acute and subacute infective endocarditis: Secondary | ICD-10-CM | POA: Diagnosis not present

## 2022-07-06 DIAGNOSIS — I340 Nonrheumatic mitral (valve) insufficiency: Secondary | ICD-10-CM | POA: Diagnosis not present

## 2022-07-06 DIAGNOSIS — R5381 Other malaise: Secondary | ICD-10-CM | POA: Diagnosis not present

## 2022-07-09 DIAGNOSIS — R5381 Other malaise: Secondary | ICD-10-CM | POA: Diagnosis not present

## 2022-07-19 ENCOUNTER — Ambulatory Visit (INDEPENDENT_AMBULATORY_CARE_PROVIDER_SITE_OTHER): Payer: Medicare HMO | Admitting: Family

## 2022-07-19 ENCOUNTER — Encounter: Payer: Self-pay | Admitting: Family

## 2022-07-19 VITALS — BP 130/78 | HR 72 | Temp 97.7°F | Ht 68.0 in | Wt 148.0 lb

## 2022-07-19 DIAGNOSIS — E782 Mixed hyperlipidemia: Secondary | ICD-10-CM

## 2022-07-19 DIAGNOSIS — D509 Iron deficiency anemia, unspecified: Secondary | ICD-10-CM | POA: Diagnosis not present

## 2022-07-19 DIAGNOSIS — I330 Acute and subacute infective endocarditis: Secondary | ICD-10-CM

## 2022-07-19 DIAGNOSIS — J341 Cyst and mucocele of nose and nasal sinus: Secondary | ICD-10-CM | POA: Diagnosis not present

## 2022-07-19 DIAGNOSIS — Z8673 Personal history of transient ischemic attack (TIA), and cerebral infarction without residual deficits: Secondary | ICD-10-CM

## 2022-07-19 DIAGNOSIS — I63512 Cerebral infarction due to unspecified occlusion or stenosis of left middle cerebral artery: Secondary | ICD-10-CM

## 2022-07-19 LAB — IBC + FERRITIN
Ferritin: 155.1 ng/mL (ref 22.0–322.0)
Iron: 101 ug/dL (ref 42–165)
Saturation Ratios: 28.2 % (ref 20.0–50.0)
TIBC: 358.4 ug/dL (ref 250.0–450.0)
Transferrin: 256 mg/dL (ref 212.0–360.0)

## 2022-07-19 LAB — CBC WITH DIFFERENTIAL/PLATELET
Basophils Absolute: 0 10*3/uL (ref 0.0–0.1)
Basophils Relative: 0.4 % (ref 0.0–3.0)
Eosinophils Absolute: 0.1 10*3/uL (ref 0.0–0.7)
Eosinophils Relative: 1.2 % (ref 0.0–5.0)
HCT: 40.9 % (ref 39.0–52.0)
Hemoglobin: 13.3 g/dL (ref 13.0–17.0)
Lymphocytes Relative: 18.7 % (ref 12.0–46.0)
Lymphs Abs: 1.2 10*3/uL (ref 0.7–4.0)
MCHC: 32.4 g/dL (ref 30.0–36.0)
MCV: 101.5 fl — ABNORMAL HIGH (ref 78.0–100.0)
Monocytes Absolute: 0.4 10*3/uL (ref 0.1–1.0)
Monocytes Relative: 6.4 % (ref 3.0–12.0)
Neutro Abs: 4.9 10*3/uL (ref 1.4–7.7)
Neutrophils Relative %: 73.3 % (ref 43.0–77.0)
Platelets: 201 10*3/uL (ref 150.0–400.0)
RBC: 4.03 Mil/uL — ABNORMAL LOW (ref 4.22–5.81)
RDW: 17.5 % — ABNORMAL HIGH (ref 11.5–15.5)
WBC: 6.6 10*3/uL (ref 4.0–10.5)

## 2022-07-19 MED ORDER — ASPIRIN 81 MG PO CHEW: 81.0000 mg | CHEWABLE_TABLET | Freq: Every day | ORAL | 3 refills | Status: AC

## 2022-07-19 MED ORDER — ATORVASTATIN CALCIUM 10 MG PO TABS: 10.0000 mg | ORAL_TABLET | Freq: Every day | ORAL | 3 refills | Status: DC

## 2022-07-19 NOTE — Assessment & Plan Note (Signed)
Asymptomatic at present Maintain appt with Ent.  I wrote this down for him and printed date and address

## 2022-07-19 NOTE — Progress Notes (Addendum)
Established Patient Hospital follow up Visit Subjective:   Patient ID: Lawrence Wong, male    DOB: 07/16/1940  Age: 82 y.o. MRN: [id number]  CC:  Chief Complaint  Patient presents with   Follow-up        HPI  Lawrence Wong is a 82 y.o. male presenting on 07/19/2022 for  hospital follow up.   Pt is not accompanied today by anyone, he states his sister drove him but refuses to let her come into room, It is not appropriate for pt to be unaccompanied with new dx and increased confusion.   Hospital: [facility] Admit: 05/22/22 Discharge: discharged a few days ago by [facility] rehab.  Discharge medications: started on asa 81 mg, advised to continue atorvastatin 10 mg.  Dx with dx of acute L MCA territory stroke, admitted for worsening confusion and global aphasia on 12/23.  BC x 2 12/26 with strep sanguinous bacteremia suspected dental source TTE part of stroke workup, with finding for 1.4 cm x 0.6 cm mobile echodensity with mitral valve and severe eccentric MR, TEE discrte aneurysm P3 scallop.  Was prescribed CTX 2 g for six weeks, with end date 07/08/22.  Also had dental extraction.  Recommendation was for repeat echo to eval for persistent MV endocarditis after finishing antbx which is now completed.  Right ethomoid air cell mucocele, seen incidentally on CT head. ENT consulted, findings not thought to be related to stroke findings, was advised to f/u after discharge.  Referrals placed upon discharge for ENT, Dentistry, and cardiac surgeon.   Has appt scheduled with ENT for 08/11/22  Has appt with Dr. Clyda Greener, cardiologist 10/05/22, was most recently see 07/06/22 with Dr. Clyda Greener. Evaluated for MV endocarditis, plan for cardiologist to repeat TTE after completion of antbx course.   Acute concerns:oi  Currently lives at home alone. He no longer has his license he was driven here by his sister who has been checking in on him at home.     No Known  Allergies ----------------   Social history:  Relevant past medical, surgical, family and social history reviewed and updated as indicated. Interim medical history since our last visit reviewed.  Allergies and medications reviewed and updated.  DATA REVIEWED: CHART IN EPIC    ROS: Negative unless specifically indicated above in HPI.    Current Outpatient Medications:    aspirin 81 MG chewable tablet, Chew 1 tablet (81 mg total) by mouth daily., Disp: 90 tablet, Rfl: 3   atorvastatin (LIPITOR) 10 MG tablet, Take 1 tablet (10 mg total) by mouth daily., Disp: 90 tablet, Rfl: 3   Cholecalciferol (VITAMIN D3) 100000 UNIT/GM POWD, Take by mouth., Disp: , Rfl:       Objective:    BP 130/78   Pulse 72   Temp 97.7 F (36.5 C) (Temporal)   Ht 5' 8"$  (1.727 m)   Wt 148 lb (67.1 kg)   SpO2 98%   BMI 22.50 kg/m   Wt Readings from Last 3 Encounters:  07/19/22 148 lb (67.1 kg)  05/07/22 148 lb 8 oz (67.4 kg)  03/22/22 151 lb 2 oz (68.5 kg)    Physical Exam Constitutional:      General: He is not in acute distress.    Appearance: Normal appearance. He is normal weight. He is not ill-appearing, toxic-appearing or diaphoretic.  Cardiovascular:     Rate and Rhythm: Normal rate and regular rhythm.     Heart sounds: Murmur heard.  Pulmonary:  Effort: Pulmonary effort is normal.  Musculoskeletal:        General: Normal range of motion.  Neurological:     General: No focal deficit present.     Mental Status: He is alert and oriented to person, place, and time. Mental status is at baseline.     Cranial Nerves: Cranial nerves 2-12 are intact. No cranial nerve deficit or facial asymmetry.     Sensory: Sensation is intact.     Motor: Motor function is intact.     Gait: Gait abnormal (slightly unstable).     Comments: Responds appropriately to commands  Aox3 at current   Psychiatric:        Mood and Affect: Mood normal.        Behavior: Behavior normal.        Thought Content:  Thought content normal.        Judgment: Judgment normal.         Assessment & Plan:  Iron deficiency anemia, unspecified iron deficiency anemia type Assessment & Plan: Repeat cbc ibc ferritin today pending results  Orders: -     CBC with Differential/Platelet -     IBC + Ferritin  Mixed hyperlipidemia -     Atorvastatin Calcium; Take 1 tablet (10 mg total) by mouth daily.  Dispense: 90 tablet; Refill: 3  Cerebrovascular accident (CVA) due to occlusion of left middle cerebral artery [facility]) Assessment & Plan: Advised pt needs to take daily asa 81 mg , sent refill to pharmacy.  Some inappropriate responses to questions asked.  Did advise pt that we need to have a DPR for someone that we can discuss his care with to make sure no increased confusion at home, and to keep him on track with medication compliance.  Pt is no longer driving, without license.  Continue f/u with cardiologist as scheduled.  Orders: -     Aspirin; Chew 1 tablet (81 mg total) by mouth daily.  Dispense: 90 tablet; Refill: 3  Acute and subacute infective endocarditis  Mucocele of nasal sinus Assessment & Plan: Asymptomatic at present Maintain appt with Ent.  I wrote this down for him and printed date and address       Return in about 3 months (around 10/17/2022) for f/u CVA .  Eugenia Pancoast, FNP

## 2022-07-19 NOTE — Assessment & Plan Note (Signed)
Repeat cbc ibc ferritin today pending results

## 2022-07-19 NOTE — Assessment & Plan Note (Signed)
Advised pt needs to take daily asa 81 mg , sent refill to pharmacy.  Some inappropriate responses to questions asked.  Did advise pt that we need to have a DPR for someone that we can discuss his care with to make sure no increased confusion at home, and to keep him on track with medication compliance.  Pt is no longer driving, without license.  Continue f/u with cardiologist as scheduled.

## 2022-07-19 NOTE — Patient Instructions (Addendum)
  Restart aspirin 81 mg once daily  Continue/restart atorvastatin 10 mg once daily for management of cholesterol   ------------------------------------  Follow up with cardiologist as scheduled in May 7th at 1:45 Armando Reichert, MD Cardiovascular Disease NPI: [id number] [place] [address]  [phone] ------------------------------------  Follow up with Ear nose and throat doctor in March 13th , at 315 pm   [facility] at [place], [place] [address]   [place],   Eugenia Pancoast FNP-C

## 2022-07-20 DIAGNOSIS — I340 Nonrheumatic mitral (valve) insufficiency: Secondary | ICD-10-CM | POA: Diagnosis not present

## 2022-07-20 DIAGNOSIS — M6281 Muscle weakness (generalized): Secondary | ICD-10-CM | POA: Diagnosis not present

## 2022-07-20 DIAGNOSIS — Z7982 Long term (current) use of aspirin: Secondary | ICD-10-CM | POA: Diagnosis not present

## 2022-07-20 DIAGNOSIS — I69320 Aphasia following cerebral infarction: Secondary | ICD-10-CM | POA: Diagnosis not present

## 2022-07-20 DIAGNOSIS — R1312 Dysphagia, oropharyngeal phase: Secondary | ICD-10-CM | POA: Diagnosis not present

## 2022-07-20 DIAGNOSIS — I69318 Other symptoms and signs involving cognitive functions following cerebral infarction: Secondary | ICD-10-CM | POA: Diagnosis not present

## 2022-07-20 DIAGNOSIS — K5900 Constipation, unspecified: Secondary | ICD-10-CM | POA: Diagnosis not present

## 2022-07-20 DIAGNOSIS — E785 Hyperlipidemia, unspecified: Secondary | ICD-10-CM | POA: Diagnosis not present

## 2022-07-20 DIAGNOSIS — D509 Iron deficiency anemia, unspecified: Secondary | ICD-10-CM | POA: Diagnosis not present

## 2022-07-20 DIAGNOSIS — E119 Type 2 diabetes mellitus without complications: Secondary | ICD-10-CM | POA: Diagnosis not present

## 2022-07-20 DIAGNOSIS — I330 Acute and subacute infective endocarditis: Secondary | ICD-10-CM | POA: Diagnosis not present

## 2022-07-20 DIAGNOSIS — I69398 Other sequelae of cerebral infarction: Secondary | ICD-10-CM | POA: Diagnosis not present

## 2022-07-20 DIAGNOSIS — C61 Malignant neoplasm of prostate: Secondary | ICD-10-CM | POA: Diagnosis not present

## 2022-07-20 NOTE — Progress Notes (Signed)
Labs stable

## 2022-07-29 ENCOUNTER — Encounter: Payer: Self-pay | Admitting: Family

## 2022-07-29 NOTE — Progress Notes (Signed)
Yes please let's mail them.

## 2022-08-11 DIAGNOSIS — J3489 Other specified disorders of nose and nasal sinuses: Secondary | ICD-10-CM | POA: Diagnosis not present

## 2022-08-17 ENCOUNTER — Encounter (HOSPITAL_COMMUNITY)
Admission: RE | Admit: 2022-08-17 | Discharge: 2022-08-17 | Disposition: A | Discharge status: Home / Self Care | Payer: Medicare HMO | Source: Ambulatory Visit | Attending: Urology | Admitting: Urology

## 2022-08-17 DIAGNOSIS — R9721 Rising PSA following treatment for malignant neoplasm of prostate: Secondary | ICD-10-CM

## 2022-08-17 DIAGNOSIS — Z8546 Personal history of malignant neoplasm of prostate: Secondary | ICD-10-CM | POA: Diagnosis not present

## 2022-08-17 DIAGNOSIS — C61 Malignant neoplasm of prostate: Secondary | ICD-10-CM | POA: Diagnosis not present

## 2022-08-17 MED ORDER — PIFLIFOLASTAT F 18 (PYLARIFY) INJECTION
9.0000 | Freq: Once | INTRAVENOUS | Status: AC
  Administered 2022-08-17: 9.81 via INTRAVENOUS

## 2022-08-19 DIAGNOSIS — J320 Chronic maxillary sinusitis: Secondary | ICD-10-CM | POA: Diagnosis not present

## 2022-08-19 DIAGNOSIS — J3489 Other specified disorders of nose and nasal sinuses: Secondary | ICD-10-CM | POA: Diagnosis not present

## 2022-08-19 DIAGNOSIS — J329 Chronic sinusitis, unspecified: Secondary | ICD-10-CM | POA: Diagnosis not present

## 2022-10-05 DIAGNOSIS — I059 Rheumatic mitral valve disease, unspecified: Secondary | ICD-10-CM | POA: Diagnosis not present

## 2022-10-05 DIAGNOSIS — I639 Cerebral infarction, unspecified: Secondary | ICD-10-CM | POA: Diagnosis not present

## 2022-10-05 DIAGNOSIS — E119 Type 2 diabetes mellitus without complications: Secondary | ICD-10-CM | POA: Diagnosis not present

## 2022-10-05 DIAGNOSIS — I38 Endocarditis, valve unspecified: Secondary | ICD-10-CM | POA: Diagnosis not present

## 2022-10-05 DIAGNOSIS — Z7982 Long term (current) use of aspirin: Secondary | ICD-10-CM | POA: Diagnosis not present

## 2022-10-05 DIAGNOSIS — I310 Chronic adhesive pericarditis: Secondary | ICD-10-CM | POA: Diagnosis not present

## 2022-10-05 DIAGNOSIS — C61 Malignant neoplasm of prostate: Secondary | ICD-10-CM | POA: Diagnosis not present

## 2022-10-05 DIAGNOSIS — I700 Atherosclerosis of aorta: Secondary | ICD-10-CM | POA: Diagnosis not present

## 2022-10-05 DIAGNOSIS — E785 Hyperlipidemia, unspecified: Secondary | ICD-10-CM | POA: Diagnosis not present

## 2022-10-05 DIAGNOSIS — I10 Essential (primary) hypertension: Secondary | ICD-10-CM | POA: Diagnosis not present

## 2022-10-05 DIAGNOSIS — I340 Nonrheumatic mitral (valve) insufficiency: Secondary | ICD-10-CM | POA: Diagnosis not present

## 2022-10-05 DIAGNOSIS — Z79899 Other long term (current) drug therapy: Secondary | ICD-10-CM | POA: Diagnosis not present

## 2022-10-05 DIAGNOSIS — R4701 Aphasia: Secondary | ICD-10-CM | POA: Diagnosis not present

## 2022-10-06 NOTE — Progress Notes (Signed)
noted 

## 2022-10-12 ENCOUNTER — Telehealth: Payer: Self-pay | Admitting: Family

## 2022-10-12 NOTE — Telephone Encounter (Signed)
Home Health verbal orders Caller Name: Guido Sander  Agency Name: [facility]  Callback number: [phone]  Requesting Social Work  Reason: Evaluation  Frequency: 1x a week for 1 week  Please forward to [facility] pool or providers CMA

## 2022-10-13 NOTE — Telephone Encounter (Signed)
ok for verbal orders to be called in

## 2022-10-13 NOTE — Telephone Encounter (Signed)
Spoke with Guido Sander and provided the verbal orders.

## 2022-10-18 ENCOUNTER — Ambulatory Visit (INDEPENDENT_AMBULATORY_CARE_PROVIDER_SITE_OTHER): Payer: Medicare HMO | Admitting: Family

## 2022-10-18 ENCOUNTER — Encounter: Payer: Self-pay | Admitting: Family

## 2022-10-18 VITALS — BP 158/82 | HR 60 | Temp 97.2°F | Ht 68.0 in | Wt 161.0 lb

## 2022-10-18 DIAGNOSIS — I63512 Cerebral infarction due to unspecified occlusion or stenosis of left middle cerebral artery: Secondary | ICD-10-CM

## 2022-10-18 DIAGNOSIS — F09 Unspecified mental disorder due to known physiological condition: Secondary | ICD-10-CM

## 2022-10-18 DIAGNOSIS — I10 Essential (primary) hypertension: Secondary | ICD-10-CM | POA: Diagnosis not present

## 2022-10-18 DIAGNOSIS — E1169 Type 2 diabetes mellitus with other specified complication: Secondary | ICD-10-CM

## 2022-10-18 DIAGNOSIS — E782 Mixed hyperlipidemia: Secondary | ICD-10-CM

## 2022-10-18 DIAGNOSIS — D500 Iron deficiency anemia secondary to blood loss (chronic): Secondary | ICD-10-CM | POA: Diagnosis not present

## 2022-10-18 DIAGNOSIS — I639 Cerebral infarction, unspecified: Secondary | ICD-10-CM | POA: Diagnosis not present

## 2022-10-18 LAB — CBC WITH DIFFERENTIAL/PLATELET
Basophils Absolute: 0 10*3/uL (ref 0.0–0.1)
Basophils Relative: 0.5 % (ref 0.0–3.0)
Eosinophils Absolute: 0.2 10*3/uL (ref 0.0–0.7)
Eosinophils Relative: 2.4 % (ref 0.0–5.0)
HCT: 42.1 % (ref 39.0–52.0)
Hemoglobin: 14.1 g/dL (ref 13.0–17.0)
Lymphocytes Relative: 19.6 % (ref 12.0–46.0)
Lymphs Abs: 1.3 10*3/uL (ref 0.7–4.0)
MCHC: 33.4 g/dL (ref 30.0–36.0)
MCV: 98.7 fl (ref 78.0–100.0)
Monocytes Absolute: 0.5 10*3/uL (ref 0.1–1.0)
Monocytes Relative: 8.2 % (ref 3.0–12.0)
Neutro Abs: 4.6 10*3/uL (ref 1.4–7.7)
Neutrophils Relative %: 69.3 % (ref 43.0–77.0)
Platelets: 190 10*3/uL (ref 150.0–400.0)
RBC: 4.27 Mil/uL (ref 4.22–5.81)
RDW: 14.4 % (ref 11.5–15.5)
WBC: 6.7 10*3/uL (ref 4.0–10.5)

## 2022-10-18 LAB — LIPID PANEL
Cholesterol: 189 mg/dL (ref 0–200)
HDL: 39.2 mg/dL (ref >39.00)
LDL Cholesterol: 130 mg/dL — ABNORMAL HIGH (ref 0–99)
NonHDL: 149.59
Total CHOL/HDL Ratio: 5
Triglycerides: 100 mg/dL (ref 0.0–149.0)
VLDL: 20 mg/dL (ref 0.0–40.0)

## 2022-10-18 LAB — MICROALBUMIN / CREATININE URINE RATIO
Creatinine,U: 93.6 mg/dL
Microalb Creat Ratio: 3 mg/g (ref 0.0–30.0)
Microalb, Ur: 2.8 mg/dL — ABNORMAL HIGH (ref 0.0–1.9)

## 2022-10-18 LAB — HEMOGLOBIN A1C: Hgb A1c MFr Bld: 5.9 % (ref 4.6–6.5)

## 2022-10-18 MED ORDER — ATORVASTATIN CALCIUM 10 MG PO TABS: 10.0000 mg | ORAL_TABLET | Freq: Every day | ORAL | 3 refills | Status: AC

## 2022-10-18 MED ORDER — LOSARTAN POTASSIUM 25 MG PO TABS: 25.0000 mg | ORAL_TABLET | Freq: Every day | ORAL | 1 refills | Status: DC

## 2022-10-18 NOTE — Assessment & Plan Note (Signed)
Did not start losartan as instructed by cardiologist. Mildly elevated in office.  Advised to start losartan 25 mg once daily F/u with cardiology as scheduled.

## 2022-10-18 NOTE — Progress Notes (Signed)
Established Patient Office Visit  Subjective:      CC:  Chief Complaint  Patient presents with   Medical Management of Chronic Issues    HPI: Lawrence Wong is a 82 y.o. male presenting on 10/18/2022 for Medical Management of Chronic Issues . Accompanied today by her neighbor.   H/o CVA, still taking daily aspirin. Still not longer driving, with a license.   Also saw the ENT for mucocele of nasal sinus.   HTN: slightly elevated. Per last cardiology note they wanted him to start on losartan 25 mg once daily however he states did not pick up from pharmacy, wasn't aware he needed to start this.   With some cognitive decline evidenced as well in the office. He at times does not answer questions appropriately possible some expressive aphasia following recent ischemic cva. He is not taking medication as prescribed, not taking atorvastatin, and is unclear at times on what he is taking. He has not seen a neurologist.     Social history:  Relevant past medical, surgical, family and social history reviewed and updated as indicated. Interim medical history since our last visit reviewed.  Allergies and medications reviewed and updated.  DATA REVIEWED: CHART IN EPIC     ROS: Negative unless specifically indicated above in HPI.    Current Outpatient Medications:    aspirin 81 MG chewable tablet, Chew 1 tablet (81 mg total) by mouth daily., Disp: 90 tablet, Rfl: 3   Cholecalciferol (VITAMIN D3) 100000 UNIT/GM POWD, Take by mouth., Disp: , Rfl:    losartan (COZAAR) 25 MG tablet, Take 1 tablet (25 mg total) by mouth daily., Disp: 90 tablet, Rfl: 1   atorvastatin (LIPITOR) 10 MG tablet, Take 1 tablet (10 mg total) by mouth daily., Disp: 90 tablet, Rfl: 3      Objective:    BP (!) 158/82   Pulse 60   Temp (!) 97.2 F (36.2 C) (Temporal)   Ht 5\' 8"  (1.727 m)   Wt 161 lb (73 kg)   SpO2 98%   BMI 24.48 kg/m   Wt Readings from Last 3 Encounters:  10/18/22 161 lb (73 kg)   07/19/22 148 lb (67.1 kg)  05/07/22 148 lb 8 oz (67.4 kg)    Physical Exam  Physical Exam Constitutional:      General: not in acute distress.    Appearance: Normal appearance. normal weight. is not ill-appearing, toxic-appearing or diaphoretic.  Cardiovascular:     Rate and Rhythm: Normal rate.  Pulmonary:     Effort: Pulmonary effort is normal.  Musculoskeletal:        General: Normal range of motion.  Neurological:     General: No focal deficit present.     Mental Status: alert and oriented to person, place, and time. Mental status is at baseline.  Psychiatric:        Mood and Affect: Mood normal.        Behavior: Behavior normal.        Thought Content: Thought content normal.        Judgment: Judgment normal.     He is with some language comprehension issues and is unclear what medications he is and is not taking. He states that he prepares his own medication but that he is only taking a baby aspirin 81 mg once daily.    Assessment & Plan:  Ischemic stroke [facility]) -     Ambulatory referral to Neurology  Mixed hyperlipidemia -     Atorvastatin  Calcium; Take 1 tablet (10 mg total) by mouth daily.  Dispense: 90 tablet; Refill: 3 -     Lipid panel  Type 2 diabetes mellitus with other specified complication, without long-term current use of insulin (HCC) Assessment & Plan: Repeat hga1c pending results.   Orders: -     Hemoglobin A1c -     Microalbumin / creatinine urine ratio  Mild cognitive disorder Assessment & Plan: Worsening Possible expressive aphagia as well.  Referral placed for neurologist for eval/treat  Orders: -     Ambulatory referral to Neurology -     Vitamin B12  Primary hypertension Assessment & Plan: Did not start losartan as instructed by cardiologist. Mildly elevated in office.  Advised to start losartan 25 mg once daily F/u with cardiology as scheduled.  Orders: -     Losartan Potassium; Take 1 tablet (25 mg total) by mouth daily.   Dispense: 90 tablet; Refill: 1 -     Microalbumin / creatinine urine ratio  Iron deficiency anemia due to chronic blood loss Assessment & Plan: Check cbc ibc ferritin pending results.  Orders: -     CBC with Differential/Platelet -     IBC + Ferritin  Cerebrovascular accident (CVA) due to occlusion of left middle cerebral artery (HCC) Assessment & Plan: Continue asa 81 mg once daily  Discussed importance of taking his atorvastatin daily. Sent in new RX.       Return in about 3 months (around 01/18/2023) for f/u blood pressure.  Mort Sawyers, MSN, APRN, FNP-C [facility]

## 2022-10-18 NOTE — Assessment & Plan Note (Signed)
Worsening Possible expressive aphagia as well.  Referral placed for neurologist for eval/treat

## 2022-10-18 NOTE — Assessment & Plan Note (Signed)
Continue asa 81 mg once daily  Discussed importance of taking his atorvastatin daily. Sent in new RX.

## 2022-10-18 NOTE — Patient Instructions (Addendum)
  Start losartan 25 mg once daily  And start atorvastatin 10 mg nightly.  Continue asa 81 mg once daily.  Continue cholecalciferol (vitamin D)   Losartan is for your blood pressure Atorvastatin is for cholesterol  Baby aspirin is for stroke history   A referral was placed today for neurology.  Please let us know if you have not heard back within 2 weeks about the referral.   Regards,   Ellamay Fors FNP-C

## 2022-10-18 NOTE — Assessment & Plan Note (Signed)
Check cbc ibc ferritin pending results.  

## 2022-10-18 NOTE — Assessment & Plan Note (Signed)
Repeat hga1c pending results.

## 2022-10-19 LAB — VITAMIN B12: Vitamin B-12: 319 pg/mL (ref 211–911)

## 2022-10-19 LAB — IBC + FERRITIN
Ferritin: 36.1 ng/mL (ref 22.0–322.0)
Iron: 115 ug/dL (ref 42–165)
Saturation Ratios: 31.8 % (ref 20.0–50.0)
TIBC: 361.2 ug/dL (ref 250.0–450.0)
Transferrin: 258 mg/dL (ref 212.0–360.0)

## 2022-10-19 NOTE — Progress Notes (Signed)
B12 on the lower end could benefit from daily 1000 mcg once daily B12.  Cholesterol only slightly elevated work on low cholesterol diet.  Urine microalbumin is elevated however he is on losartan which protects the kidneys.

## 2022-10-21 ENCOUNTER — Encounter: Payer: Self-pay | Admitting: *Deleted

## 2022-11-04 ENCOUNTER — Telehealth: Payer: Self-pay | Admitting: Family

## 2022-11-04 NOTE — Telephone Encounter (Signed)
Need verbal for [facility]

## 2022-11-04 NOTE — Telephone Encounter (Signed)
Called [phone], unable to reach anyone and there was not a greeting on voicemail. Did not leave a voicemail. Will try again later.

## 2022-11-04 NOTE — Telephone Encounter (Signed)
Lawrence Wong called requesting HH discharge, effective the week of 10/31/22? Call back # [phone]

## 2022-11-05 NOTE — Telephone Encounter (Signed)
Called and spoke with Lawrence Wong. Provided verbal orders.

## 2022-11-05 NOTE — Telephone Encounter (Signed)
HH agency contacted the office again, is requesting verbal orders to have a home health nurse evaluation for this patient. Reasoning being for the patient's blood pressure was 152/72. Would like for thisto happen next week 6/10. Also requesting OT continuation for 1wk 1, reasoning or this to be ADLS IADLS. Please advise [phone].

## 2022-11-10 ENCOUNTER — Telehealth: Payer: Self-pay | Admitting: Family

## 2022-11-10 NOTE — Telephone Encounter (Signed)
Lawrence Wong [phone] [facility]  Normal BP today, but sees a neurologist next week or week after Does she want to follow him with anything else? At end of re-cert period, but   3 meds from blood pressure Good living environment  May not need nursing

## 2022-11-10 NOTE — Telephone Encounter (Signed)
Are they asking if he needs further care with home health?

## 2022-11-10 NOTE — Telephone Encounter (Signed)
Patient dropped off document Home Health Certificate (Order ID [id number]), to be filled out by provider. Patient requested to send it via Fax within 2-days. Document is located in providers  s drive

## 2022-11-10 NOTE — Telephone Encounter (Signed)
Lawrence Wong called back in and relayed message below. She stated that he is very stable right now and she was wanting to know if you think he needed any other services. She stated that he is doing good at the moment and doesn't need anything right now. Thank you!

## 2022-11-11 NOTE — Telephone Encounter (Signed)
Called and spoke with Lawrence Wong to make her aware that Lawrence Wong agrees with stopping [facility] services.

## 2022-11-11 NOTE — Telephone Encounter (Signed)
Ok for now I think that we are ok and he can stop with services until further evaluated.

## 2022-11-12 ENCOUNTER — Telehealth: Payer: Self-pay | Admitting: Family

## 2022-11-12 NOTE — Telephone Encounter (Signed)
Lawrence Wong from [facility] called to report that they would like to move [facility] discharge effective week of June 16th.

## 2022-11-15 NOTE — Telephone Encounter (Signed)
Ok for verbal orders ?

## 2022-11-15 NOTE — Telephone Encounter (Signed)
Called and gave verbal orders as directed. 

## 2022-11-23 DIAGNOSIS — F09 Unspecified mental disorder due to known physiological condition: Secondary | ICD-10-CM | POA: Diagnosis not present

## 2022-11-23 DIAGNOSIS — E119 Type 2 diabetes mellitus without complications: Secondary | ICD-10-CM | POA: Diagnosis not present

## 2022-11-23 DIAGNOSIS — R413 Other amnesia: Secondary | ICD-10-CM | POA: Diagnosis not present

## 2022-11-23 DIAGNOSIS — Z8673 Personal history of transient ischemic attack (TIA), and cerebral infarction without residual deficits: Secondary | ICD-10-CM | POA: Diagnosis not present

## 2022-11-24 DIAGNOSIS — C61 Malignant neoplasm of prostate: Secondary | ICD-10-CM | POA: Diagnosis not present

## 2022-12-07 DIAGNOSIS — C61 Malignant neoplasm of prostate: Secondary | ICD-10-CM | POA: Diagnosis not present

## 2022-12-09 DIAGNOSIS — N189 Chronic kidney disease, unspecified: Secondary | ICD-10-CM | POA: Diagnosis not present

## 2022-12-09 DIAGNOSIS — R011 Cardiac murmur, unspecified: Secondary | ICD-10-CM | POA: Diagnosis not present

## 2022-12-09 DIAGNOSIS — N529 Male erectile dysfunction, unspecified: Secondary | ICD-10-CM | POA: Diagnosis not present

## 2022-12-09 DIAGNOSIS — Z87891 Personal history of nicotine dependence: Secondary | ICD-10-CM | POA: Diagnosis not present

## 2022-12-09 DIAGNOSIS — M1990 Unspecified osteoarthritis, unspecified site: Secondary | ICD-10-CM | POA: Diagnosis not present

## 2022-12-09 DIAGNOSIS — E785 Hyperlipidemia, unspecified: Secondary | ICD-10-CM | POA: Diagnosis not present

## 2022-12-09 DIAGNOSIS — R32 Unspecified urinary incontinence: Secondary | ICD-10-CM | POA: Diagnosis not present

## 2022-12-09 DIAGNOSIS — I69319 Unspecified symptoms and signs involving cognitive functions following cerebral infarction: Secondary | ICD-10-CM | POA: Diagnosis not present

## 2022-12-09 DIAGNOSIS — I700 Atherosclerosis of aorta: Secondary | ICD-10-CM | POA: Diagnosis not present

## 2022-12-09 DIAGNOSIS — C61 Malignant neoplasm of prostate: Secondary | ICD-10-CM | POA: Diagnosis not present

## 2022-12-09 DIAGNOSIS — Z5982 Transportation insecurity: Secondary | ICD-10-CM | POA: Diagnosis not present

## 2022-12-09 DIAGNOSIS — F4320 Adjustment disorder, unspecified: Secondary | ICD-10-CM | POA: Diagnosis not present

## 2022-12-29 ENCOUNTER — Encounter (INDEPENDENT_AMBULATORY_CARE_PROVIDER_SITE_OTHER): Payer: Self-pay

## 2023-01-11 DIAGNOSIS — I340 Nonrheumatic mitral (valve) insufficiency: Secondary | ICD-10-CM | POA: Diagnosis not present

## 2023-01-11 DIAGNOSIS — I10 Essential (primary) hypertension: Secondary | ICD-10-CM | POA: Diagnosis not present

## 2023-01-13 ENCOUNTER — Ambulatory Visit (INDEPENDENT_AMBULATORY_CARE_PROVIDER_SITE_OTHER): Payer: Medicare HMO

## 2023-01-13 VITALS — Wt 161.0 lb

## 2023-01-13 DIAGNOSIS — Z0000 Encounter for general adult medical examination without abnormal findings: Secondary | ICD-10-CM

## 2023-01-13 NOTE — Progress Notes (Signed)
Subjective:   Lawrence Wong is a 82 y.o. male who presents for Medicare Annual/Subsequent preventive examination.  Visit Complete: Virtual  I connected with  Lawrence Wong on 01/13/23 by a audio enabled telemedicine application and verified that I am speaking with the correct person using two identifiers.  Patient Location: Home  Provider Location: Office/Clinic  I discussed the limitations of evaluation and management by telemedicine. The patient expressed understanding and agreed to proceed.  Vital Signs: Unable to obtain new vitals due to this being a telehealth visit.  Review of Systems     Cardiac Risk Factors include: advanced age (>40men, >46 women);diabetes mellitus;dyslipidemia;hypertension;male gender     Objective:    Today's Vitals   01/13/23 1105  Weight: 161 lb (73 kg)   Body mass index is 24.48 kg/m.     01/13/2023   11:10 AM 01/12/2022    8:33 AM 06/13/2020    8:45 AM 01/02/2020    1:17 PM 01/05/2018   11:24 AM  Advanced Directives  Does Patient Have a Medical Advance Directive? Yes No No No No  Type of Estate agent of [place];Living will      Does patient want to make changes to medical advance directive?   Yes (MAU/Ambulatory/Procedural Areas - Information given)    Copy of Healthcare Power of Attorney in Chart? No - copy requested      Would patient like information on creating a medical advance directive?  No - Patient declined Yes (MAU/Ambulatory/Procedural Areas - Information given) No - Patient declined Yes (MAU/Ambulatory/Procedural Areas - Information given)    Current Medications (verified) Outpatient Encounter Medications as of 01/13/2023  Medication Sig   amLODipine (NORVASC) 2.5 MG tablet Take 1 tablet by mouth daily.   aspirin 81 MG chewable tablet Chew 1 tablet (81 mg total) by mouth daily.   atorvastatin (LIPITOR) 10 MG tablet Take 1 tablet (10 mg total) by mouth daily.   Cholecalciferol (VITAMIN D3) 100000 UNIT/GM  POWD Take by mouth.   cyanocobalamin (VITAMIN B12) 1000 MCG tablet Take by mouth.   losartan (COZAAR) 25 MG tablet Take 1 tablet (25 mg total) by mouth daily.   No facility-administered encounter medications on file as of 01/13/2023.    Allergies (verified) Patient has no known allergies.   History: Past Medical History:  Diagnosis Date   Allergy    Arthritis    Asthma    Chicken pox    Chronic kidney disease    Colon polyps    Diabetes mellitus without complication (HCC)    GERD (gastroesophageal reflux disease)    Heart murmur    Hyperlipidemia    Hypertension    Prostate cancer (HCC)    Syphilis    Past Surgical History:  Procedure Laterality Date   INGUINAL LYMPH NODE BIOPSY     PROSTATE BIOPSY     Family History  Problem Relation Age of Onset   Heart disease Mother    Cancer Neg Hx    Breast cancer Neg Hx    Prostate cancer Neg Hx    Colon cancer Neg Hx    Pancreatic cancer Neg Hx    Social History   Socioeconomic History   Marital status: Single    Spouse name: Not on file   Number of children: 1   Years of education: Not on file   Highest education level: Not on file  Occupational History   Occupation: retired   Occupation: retired  Tobacco Use   Smoking status:  Former    Current packs/day: 0.00    Average packs/day: 1 pack/day for 40.0 years (40.0 ttl pk-yrs)    Types: Cigarettes    Start date: 06/01/1943    Quit date: 06/01/1983    Years since quitting: 39.6   Smokeless tobacco: Never   Tobacco comments:    States, "I have been around cigarettes my entire lift. 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      Family: one son - Custar - in [place], does not see him often; step children in [place]      Enjoys: works and keeps busy on the [employer] - mowing      Exercise: working and keeping busy   Diet: eats pretty good      Safety   Seat belts: yes   Guns: Yes  and secure   Safe in relationships: Yes    Social Determinants of Corporate investment banker Strain: Low Risk  (01/13/2023)   Overall Financial Resource Strain (CARDIA)    Difficulty of Paying Living Expenses: Not hard at all  Food Insecurity: No Food Insecurity (01/13/2023)   Hunger Vital Sign    Worried About Running Out of Food in the Last Year: Never true    Ran Out of Food in the Last Year: Never true  Transportation Needs: No Transportation Needs (01/13/2023)   PRAPARE - Administrator, Civil Service (Medical): No    Lack of Transportation (Non-Medical): No  Physical Activity: Insufficiently Active (01/13/2023)   Exercise Vital Sign    Days of Exercise per Week: 5 days    Minutes of Exercise per Session: 20 min  Stress: No Stress Concern Present (01/13/2023)   [employer] of Occupational Health - Occupational Stress Questionnaire    Feeling of Stress : Not at all  Social Connections: Moderately Isolated (01/13/2023)   Social Connection and Isolation Panel [NHANES]    Frequency of Communication with Friends and Family: More than three times a week    Frequency of Social Gatherings with Friends and Family: More than three times a week    Attends Religious Services: More than 4 times per year    Active Member of [employer] or Organizations: No    Attends Banker Meetings: Never    Marital Status: Never married    Tobacco Counseling Counseling given: Not Answered Tobacco comments: States, "I have been around cigarettes my entire lift. 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 bring in CBG monitor from home?: No  How often do you need to have someone help you when you read instructions, pamphlets, or other written materials from your doctor or pharmacy?: 1 - Never  Interpreter Needed?: No  Information entered by :: Lanier Ensign, LPN   Activities of Daily Living    01/13/2023   11:07 AM  In your present state of health, do you have any difficulty performing the following activities:  Hearing? 1  Comment HOH  Vision? 0  Difficulty concentrating or making decisions? 0  Walking or climbing stairs? 0  Dressing or bathing? 0  Doing errands, shopping? 0  Preparing Food and eating ? N  Using the Toilet? N  In the past six months, have you accidently leaked urine? Y  Comment at times dribble  Do you have problems with loss of bowel control? N  Managing your Medications? N  Managing your Finances? N  Housekeeping or managing your Housekeeping? N    Patient Care Team: Mort Sawyers, FNP as PCP - General (Family Medicine) Felicita Gage, RN as Oncology Nurse Navigator  Indicate any recent Medical Services you may have received from other than [facility] providers in the past year (date may be approximate).     Assessment:   This is a routine wellness examination for Lawrence Wong.  Hearing/Vision screen Hearing Screening - Comments:: Pt stated HOH  Vision Screening - Comments:: Pt encouraged to follow up with provider   Dietary issues and exercise activities discussed:     Goals Addressed             This Visit's Progress    Patient Stated       Start doing more exercise        Depression Screen    01/13/2023   11:10 AM 10/18/2022    9:24 AM 07/19/2022   11:00 AM 01/12/2022    8:39 AM 01/12/2021   11:32 AM 01/02/2020    1:18  PM  PHQ 2/9 Scores  PHQ - 2 Score 0 6 3 0 0 0  PHQ- 9 Score  25 17   0    Fall Risk    01/13/2023   11:13 AM 10/18/2022    9:23 AM 07/19/2022   10:59 AM 01/12/2022    8:34 AM 01/12/2021   10:36 AM  Fall Risk   Falls in the past year? 0 0 1 0 0  Number falls in past yr: 0 0 1 0 0  Injury with Fall? 0 0 0 0   Risk for fall due to : Impaired vision No Fall Risks History of fall(s)    Follow up Falls prevention discussed Falls evaluation completed Education provided;Falls evaluation completed;Falls prevention discussed      MEDICARE RISK AT HOME:  Medicare Risk at Home - 01/13/23 1113     Any stairs in or around the home? Yes    If so, are there any without handrails? No    Home free of loose throw rugs in walkways, pet beds, electrical cords, etc? Yes    Adequate lighting in your home to reduce risk of falls? Yes    Life alert? Yes    Use of a cane, walker or w/c? No    Grab bars in the bathroom? Yes    Shower chair or bench in shower? Yes    Elevated toilet seat or a handicapped toilet? Yes             TIMED UP AND GO:  Was the test performed?  No    Cognitive Function:    01/02/2020    1:21 PM  MMSE - Mini Mental State Exam  Orientation to time 5  Orientation to Place 5  Registration 3  Attention/ Calculation 5  Recall 3  Language- repeat 1        01/13/2023   11:14 AM 01/12/2022    8:37 AM  6CIT Screen  What Year? 0 points 0 points  What month? 0 points 0 points  What time? 0 points 0 points  Count back from 20 0 points 0 points  Months in reverse 0 points 0 points  Repeat phrase 0 points 0 points  Total Score 0 points 0 points    Immunizations Immunization History  Administered Date(s) Administered   Influenza, High Dose Seasonal PF 03/29/2018   Influenza,inj,Quad PF,6+ Mos 02/02/2022   Influenza-Unspecified 03/01/2019   PFIZER(Purple Top)SARS-COV-2 Vaccination 07/27/2019, 08/22/2019, 04/30/2020, 10/09/2020   Pneumococcal Conjugate-13 01/08/2016    Pneumococcal Polysaccharide-23 12/27/2011, 02/10/2022   Tdap 12/27/2011, 03/24/2022   Zoster Recombinant(Shingrix) 03/28/2017, 02/07/2020, 03/24/2022    TDAP status: Up to date  Flu Vaccine status: Due, Education has been provided regarding the importance of this vaccine. Advised may receive this vaccine at local pharmacy or Health Dept. Aware to provide a copy of the vaccination record if obtained from local pharmacy or Health Dept. Verbalized acceptance and understanding.  Pneumococcal vaccine status: Up to date  Covid-19 vaccine status: Declined, Education has been provided regarding the importance of this vaccine but patient still declined. Advised may receive this vaccine at local pharmacy or Health Dept.or vaccine clinic. Aware to provide a copy of the vaccination record if obtained from local pharmacy or Health Dept. Verbalized acceptance and understanding.  Qualifies for Shingles Vaccine? Yes   Zostavax completed Yes   Shingrix Completed?: Yes  Screening Tests Health Maintenance  Topic Date Due   FOOT EXAM  Never done   OPHTHALMOLOGY EXAM  Never done   COVID-19 Vaccine (5 - 2023-24 season) 01/29/2022   INFLUENZA VACCINE  12/30/2022   Diabetic kidney evaluation - eGFR measurement  01/14/2023   HEMOGLOBIN A1C  04/20/2023   Diabetic kidney evaluation - Urine ACR  10/18/2023   Medicare Annual Wellness (AWV)  01/13/2024   DTaP/Tdap/Td (3 - Td or Tdap) 03/24/2032   Pneumonia Vaccine 90+ Years old  Completed   Zoster Vaccines- Shingrix  Completed   HPV VACCINES  Aged Out   Hepatitis C Screening  Discontinued    Health Maintenance  Health Maintenance Due  Topic Date Due   FOOT EXAM  Never done   OPHTHALMOLOGY EXAM  Never done   COVID-19 Vaccine (5 - 2023-24 season) 01/29/2022   INFLUENZA VACCINE  12/30/2022   Diabetic kidney evaluation - eGFR measurement  01/14/2023    Colorectal cancer screening: No longer required.    Additional Screening:  Hepatitis C  Screening:  Completed 01/10/20  Vision Screening: Recommended annual ophthalmology exams for early detection of glaucoma and other disorders of the eye. Is the patient up to date with their annual eye exam?  No  Who is the provider or what is the name of the office in which the patient attends annual eye exams? Encouraged to follow up with Provider  If pt is not established with a provider, would they like to be referred to a provider to establish care? No .   Dental Screening: Recommended annual dental exams for proper oral hygiene  Diabetic Foot Exam: Diabetic Foot Exam: Overdue, Pt has been advised about the importance in completing this exam. Pt is scheduled for diabetic foot exam on next .  Community Resource Referral / Chronic Care Management: CRR required this visit?  No   CCM required  this visit?  No     Plan:     I have personally reviewed and noted the following in the patient's chart:   Medical and social history Use of alcohol, tobacco or illicit drugs  Current medications and supplements including opioid prescriptions. Patient is not currently taking opioid prescriptions. Functional ability and status Nutritional status Physical activity Advanced directives List of other physicians Hospitalizations, surgeries, and ER visits in previous 12 months Vitals Screenings to include cognitive, depression, and falls Referrals and appointments  In addition, I have reviewed and discussed with patient certain preventive protocols, quality metrics, and best practice recommendations. A written personalized care plan for preventive services as well as general preventive health recommendations were provided to patient.     Marzella Schlein, LPN   [id number]/[id number]   After Visit Summary: (MyChart) Due to this being a telephonic visit, the after visit summary with patients personalized plan was offered to patient via MyChart   Nurse Notes: none

## 2023-01-13 NOTE — Patient Instructions (Signed)
[name] , Thank you for taking time to come for your Medicare Wellness Visit. I appreciate your ongoing commitment to your health goals. Please review the following plan we discussed and let me know if I can assist you in the future.   Referrals/Orders/Follow-Ups/Clinician Recommendations: get into more exercise   This is a list of the screening recommended for you and due dates:  Health Maintenance  Topic Date Due   Complete foot exam   Never done   Eye exam for diabetics  Never done   COVID-19 Vaccine (5 - 2023-24 season) 01/29/2022   Flu Shot  12/30/2022   Yearly kidney function blood test for diabetes  01/14/2023   Hemoglobin A1C  04/20/2023   Yearly kidney health urinalysis for diabetes  10/18/2023   Medicare Annual Wellness Visit  01/13/2024   DTaP/Tdap/Td vaccine (3 - Td or Tdap) 03/24/2032   Pneumonia Vaccine  Completed   Zoster (Shingles) Vaccine  Completed   HPV Vaccine  Aged Out   Hepatitis C Screening  Discontinued    Advanced directives: (Copy Requested) Please bring a copy of your health care power of attorney and living will to the office to be added to your chart at your convenience.  Next Medicare Annual Wellness Visit scheduled for next year: Yes  Preventive Care 64 Years and Older, Male  Preventive care refers to lifestyle choices and visits with your health care provider that can promote health and wellness. What does preventive care include? A yearly physical exam. This is also called an annual well check. Dental exams once or twice a year. Routine eye exams. Ask your health care provider how often you should have your eyes checked. Personal lifestyle choices, including: Daily care of your teeth and gums. Regular physical activity. Eating a healthy diet. Avoiding tobacco and drug use. Limiting alcohol use. Practicing safe sex. Taking low doses of aspirin every day. Taking vitamin and mineral supplements as recommended by your health care  provider. What happens during an annual well check? The services and screenings done by your health care provider during your annual well check will depend on your age, overall health, lifestyle risk factors, and family history of disease. Counseling  Your health care provider may ask you questions about your: Alcohol use. Tobacco use. Drug use. Emotional well-being. Home and relationship well-being. Sexual activity. Eating habits. History of falls. Memory and ability to understand (cognition). Work and work Astronomer. Screening  You may have the following tests or measurements: Height, weight, and BMI. Blood pressure. Lipid and cholesterol levels. These may be checked every 5 years, or more frequently if you are over 76 years old. Skin check. Lung cancer screening. You may have this screening every year starting at age 2 if you have a 30-pack-year history of smoking and currently smoke or have quit within the past 15 years. Fecal occult blood test (FOBT) of the stool. You may have this test every year starting at age 61. Flexible sigmoidoscopy or colonoscopy. You may have a sigmoidoscopy every 5 years or a colonoscopy every 10 years starting at age 32. Prostate cancer screening. Recommendations will vary depending on your family history and other risks. Hepatitis C blood test. Hepatitis B blood test. Sexually transmitted disease (STD) testing. Diabetes screening. This is done by checking your blood sugar (glucose) after you have not eaten for a while (fasting). You may have this done every 1-3 years. Abdominal aortic aneurysm (AAA) screening. You may need this if you are a current or  former smoker. Osteoporosis. You may be screened starting at age 108 if you are at high risk. Talk with your health care provider about your test results, treatment options, and if necessary, the need for more tests. Vaccines  Your health care provider may recommend certain vaccines, such  as: Influenza vaccine. This is recommended every year. Tetanus, diphtheria, and acellular pertussis (Tdap, Td) vaccine. You may need a Td booster every 10 years. Zoster vaccine. You may need this after age 41. Pneumococcal 13-valent conjugate (PCV13) vaccine. One dose is recommended after age 64. Pneumococcal polysaccharide (PPSV23) vaccine. One dose is recommended after age 68. Talk to your health care provider about which screenings and vaccines you need and how often you need them. This information is not intended to replace advice given to you by your health care provider. Make sure you discuss any questions you have with your health care provider. Document Released: 06/13/2015 Document Revised: 02/04/2016 Document Reviewed: 03/18/2015 Elsevier Interactive Patient Education  2017 [employer].  Fall Prevention in the Home Falls can cause injuries. They can happen to people of all ages. There are many things you can do to make your home safe and to help prevent falls. What can I do on the outside of my home? Regularly fix the edges of walkways and driveways and fix any cracks. Remove anything that might make you trip as you walk through a door, such as a raised step or threshold. Trim any bushes or trees on the path to your home. Use bright outdoor lighting. Clear any walking paths of anything that might make someone trip, such as rocks or tools. Regularly check to see if handrails are loose or broken. Make sure that both sides of any steps have handrails. Any raised decks and porches should have guardrails on the edges. Have any leaves, snow, or ice cleared regularly. Use sand or salt on walking paths during winter. Clean up any spills in your garage right away. This includes oil or grease spills. What can I do in the bathroom? Use night lights. Install grab bars by the toilet and in the tub and shower. Do not use towel bars as grab bars. Use non-skid mats or decals in the tub or  shower. If you need to sit down in the shower, use a plastic, non-slip stool. Keep the floor dry. Clean up any water that spills on the floor as soon as it happens. Remove soap buildup in the tub or shower regularly. Attach bath mats securely with double-sided non-slip rug tape. Do not have throw rugs and other things on the floor that can make you trip. What can I do in the bedroom? Use night lights. Make sure that you have a light by your bed that is easy to reach. Do not use any sheets or blankets that are too big for your bed. They should not hang down onto the floor. Have a firm chair that has side arms. You can use this for support while you get dressed. Do not have throw rugs and other things on the floor that can make you trip. What can I do in the kitchen? Clean up any spills right away. Avoid walking on wet floors. Keep items that you use a lot in easy-to-reach places. If you need to reach something above you, use a strong step stool that has a grab bar. Keep electrical cords out of the way. Do not use floor polish or wax that makes floors slippery. If you must use wax, use  non-skid floor wax. Do not have throw rugs and other things on the floor that can make you trip. What can I do with my stairs? Do not leave any items on the stairs. Make sure that there are handrails on both sides of the stairs and use them. Fix handrails that are broken or loose. Make sure that handrails are as long as the stairways. Check any carpeting to make sure that it is firmly attached to the stairs. Fix any carpet that is loose or worn. Avoid having throw rugs at the top or bottom of the stairs. If you do have throw rugs, attach them to the floor with carpet tape. Make sure that you have a light switch at the top of the stairs and the bottom of the stairs. If you do not have them, ask someone to add them for you. What else can I do to help prevent falls? Wear shoes that: Do not have high heels. Have  rubber bottoms. Are comfortable and fit you well. Are closed at the toe. Do not wear sandals. If you use a stepladder: Make sure that it is fully opened. Do not climb a closed stepladder. Make sure that both sides of the stepladder are locked into place. Ask someone to hold it for you, if possible. Clearly mark and make sure that you can see: Any grab bars or handrails. First and last steps. Where the edge of each step is. Use tools that help you move around (mobility aids) if they are needed. These include: Canes. Walkers. Scooters. Crutches. Turn on the lights when you go into a dark area. Replace any light bulbs as soon as they burn out. Set up your furniture so you have a clear path. Avoid moving your furniture around. If any of your floors are uneven, fix them. If there are any pets around you, be aware of where they are. Review your medicines with your doctor. Some medicines can make you feel dizzy. This can increase your chance of falling. Ask your doctor what other things that you can do to help prevent falls. This information is not intended to replace advice given to you by your health care provider. Make sure you discuss any questions you have with your health care provider. Document Released: 03/13/2009 Document Revised: 10/23/2015 Document Reviewed: 06/21/2014 Elsevier Interactive Patient Education  2017 [employer].

## 2023-01-18 ENCOUNTER — Encounter: Payer: Self-pay | Admitting: Family

## 2023-01-18 ENCOUNTER — Ambulatory Visit (INDEPENDENT_AMBULATORY_CARE_PROVIDER_SITE_OTHER): Payer: Medicare HMO | Admitting: Family

## 2023-01-18 VITALS — BP 138/78 | HR 73 | Temp 97.7°F | Ht 68.0 in | Wt 163.0 lb

## 2023-01-18 DIAGNOSIS — R7303 Prediabetes: Secondary | ICD-10-CM

## 2023-01-18 DIAGNOSIS — I10 Essential (primary) hypertension: Secondary | ICD-10-CM | POA: Diagnosis not present

## 2023-01-18 DIAGNOSIS — F09 Unspecified mental disorder due to known physiological condition: Secondary | ICD-10-CM | POA: Diagnosis not present

## 2023-01-18 DIAGNOSIS — I63512 Cerebral infarction due to unspecified occlusion or stenosis of left middle cerebral artery: Secondary | ICD-10-CM

## 2023-01-18 DIAGNOSIS — E782 Mixed hyperlipidemia: Secondary | ICD-10-CM | POA: Diagnosis not present

## 2023-01-18 LAB — LIPID PANEL
Cholesterol: 137 mg/dL (ref 0–200)
HDL: 35.5 mg/dL — ABNORMAL LOW (ref >39.00)
LDL Cholesterol: 66 mg/dL (ref 0–99)
NonHDL: 101.13
Total CHOL/HDL Ratio: 4
Triglycerides: 178 mg/dL — ABNORMAL HIGH (ref 0.0–149.0)
VLDL: 35.6 mg/dL (ref 0.0–40.0)

## 2023-01-18 NOTE — Assessment & Plan Note (Signed)
Continue aspirin therapy 81 mg once daily

## 2023-01-18 NOTE — Assessment & Plan Note (Signed)
 Ordered lipid panel, pending results. Work on low cholesterol diet and exercise as tolerated Continue atorvastatin 10 mg nightly

## 2023-01-18 NOTE — Assessment & Plan Note (Signed)
Stable continue f/u with neurology as necessary

## 2023-01-18 NOTE — Assessment & Plan Note (Signed)
Stable controlled Continue amlodipine 2.5 mg once daily as well as losartan 25 mg once daily

## 2023-01-18 NOTE — Progress Notes (Signed)
Established Patient Office Visit  Subjective:      CC:  Chief Complaint  Patient presents with   Annual Exam    Pt here for Annual exam and is not currently fasting     HPI: Lawrence Wong is a 82 y.o. male presenting on 01/18/2023 for Annual Exam (Pt here for Annual exam and is not currently fasting ) . HTN: last visit advised to restart on losartan 25 mg once daily as directed by cardiology. He does state he restarted on losartan 25 mg once daily. On atorvastatin 10 mg once daily.   Ischemic stroke history, referred to neurology last visit 10/18/22 due to expressive aphagia still taking daily asa 81 mg , restarted on atorvastatin last visit as well due to non compliance. Did see Dr. Malvin Johns, neurology on 11/22/22  Who referred him to speech therapy for aphasia following stroke. Memory score was 18/30  Presenting here with Earley Abide, neighbor and giving me a notarized copy of his power of attorney.   Urology: prostate cancer, seen by urology at [facility] urology. Last visit 12/07/22 started on nubeqa 300 mg tablet  bid   Social history:  Relevant past medical, surgical, family and social history reviewed and updated as indicated. Interim medical history since our last visit reviewed.  Allergies and medications reviewed and updated.  DATA REVIEWED: CHART IN EPIC     ROS: Negative unless specifically indicated above in HPI.    Current Outpatient Medications:    amLODipine (NORVASC) 2.5 MG tablet, Take 1 tablet by mouth daily., Disp: , Rfl:    aspirin 81 MG chewable tablet, Chew 1 tablet (81 mg total) by mouth daily., Disp: 90 tablet, Rfl: 3   atorvastatin (LIPITOR) 10 MG tablet, Take 1 tablet (10 mg total) by mouth daily., Disp: 90 tablet, Rfl: 3   Cholecalciferol (VITAMIN D3) 100000 UNIT/GM POWD, Take by mouth., Disp: , Rfl:    cyanocobalamin (VITAMIN B12) 1000 MCG tablet, Take by mouth., Disp: , Rfl:    darolutamide (NUBEQA) 300 MG tablet, Take 600 mg by mouth 2 (two) times  daily with a meal., Disp: , Rfl:    losartan (COZAAR) 25 MG tablet, Take 1 tablet (25 mg total) by mouth daily., Disp: 90 tablet, Rfl: 1      Objective:    BP 138/78   Pulse 73   Temp 97.7 F (36.5 C)   Ht 5\' 8"  (1.727 m)   Wt 163 lb (73.9 kg)   SpO2 98%   BMI 24.78 kg/m   Wt Readings from Last 3 Encounters:  01/18/23 163 lb (73.9 kg)  01/13/23 161 lb (73 kg)  10/18/22 161 lb (73 kg)    Physical Exam Constitutional:      General: He is not in acute distress.    Appearance: Normal appearance. He is normal weight. He is not ill-appearing, toxic-appearing or diaphoretic.  Cardiovascular:     Rate and Rhythm: Normal rate.  Pulmonary:     Effort: Pulmonary effort is normal.  Musculoskeletal:        General: Normal range of motion.  Neurological:     General: No focal deficit present.     Mental Status: He is alert and oriented to person, place, and time. Mental status is at baseline.  Psychiatric:        Mood and Affect: Mood normal.        Behavior: Behavior normal.        Thought Content: Thought content normal.  Judgment: Judgment normal.           Assessment & Plan:  Mixed hyperlipidemia Assessment & Plan: Ordered lipid panel, pending results. Work on low cholesterol diet and exercise as tolerated Continue atorvastatin 10 mg nightly   Orders: -     Lipid panel  Cerebrovascular accident (CVA) due to occlusion of left middle cerebral artery (HCC) Assessment & Plan: Continue aspirin therapy 81 mg once daily    Primary hypertension Assessment & Plan: Stable controlled Continue amlodipine 2.5 mg once daily as well as losartan 25 mg once daily    Mild cognitive disorder Assessment & Plan: Stable continue f/u with neurology as necessary    Prediabetes Assessment & Plan: Stable.       Return in about 6 months (around 07/21/2023) for f/u blood pressure, f/u cholesterol.  Mort Sawyers, MSN, APRN, FNP-C [facility]  Medicine

## 2023-01-18 NOTE — Assessment & Plan Note (Signed)
Stable

## 2023-02-09 ENCOUNTER — Ambulatory Visit: Payer: Medicare HMO | Attending: Family | Admitting: Speech Pathology

## 2023-02-09 DIAGNOSIS — R482 Apraxia: Secondary | ICD-10-CM | POA: Insufficient documentation

## 2023-02-09 DIAGNOSIS — R41841 Cognitive communication deficit: Secondary | ICD-10-CM | POA: Diagnosis not present

## 2023-02-09 DIAGNOSIS — I63512 Cerebral infarction due to unspecified occlusion or stenosis of left middle cerebral artery: Secondary | ICD-10-CM | POA: Diagnosis not present

## 2023-02-09 DIAGNOSIS — R4701 Aphasia: Secondary | ICD-10-CM | POA: Insufficient documentation

## 2023-02-09 NOTE — Therapy (Signed)
OUTPATIENT SPEECH LANGUAGE PATHOLOGY APHASIA EVALUATION   Patient Name: Lawrence Wong MRN: [id number] DOB:12/30/1940, 82 y.o., male Today's Date: 02/09/2023  PCP: Mort Sawyers, FNP REFERRING PROVIDER: Mort Sawyers, FNP   End of Session - 02/09/23 1058     Visit Number 1    Number of Visits 25    Date for SLP Re-Evaluation 05/04/23    Authorization Type [employer] Medicare HMO/PPO    Progress Note Due on Visit 10    SLP Start Time 1100    SLP Stop Time  1145    SLP Time Calculation (min) 45 min    Activity Tolerance Patient tolerated treatment well             Past Medical History:  Diagnosis Date   Allergy    Arthritis    Asthma    Chicken pox    Chronic kidney disease    Colon polyps    Diabetes mellitus without complication (HCC)    GERD (gastroesophageal reflux disease)    Heart murmur    Hyperlipidemia    Hypertension    Prostate cancer (HCC)    Syphilis    Past Surgical History:  Procedure Laterality Date   INGUINAL LYMPH NODE BIOPSY     PROSTATE BIOPSY     Patient Active Problem List   Diagnosis Date Noted   Primary hypertension 10/18/2022   Cerebrovascular accident (CVA) due to occlusion of left middle cerebral artery (HCC) 07/19/2022   Iron deficiency anemia 07/19/2022   Mucocele of nasal sinus 07/19/2022   Ischemic stroke (HCC) 05/22/2022   Type 2 diabetes mellitus (HCC) 05/22/2022   Hematuria 05/07/2022   Prediabetes 01/13/2022   Recurrent prostate adenocarcinoma (HCC) 06/13/2020   History of colonic polyps 06/12/2020   Gastro-esophageal reflux disease without esophagitis 06/12/2020   Atherosclerosis of aorta (HCC) 01/10/2020   Mild cognitive disorder 07/26/2019   Hyperlipidemia 07/09/2019   Arthritis 07/09/2019   Malignant neoplasm of prostate (HCC) 02/09/2018    ONSET DATE: 05/22/2022; Date of referral  11/24/2022  REFERRING DIAG: Z86.73 (ICD-10-CM) - Personal history of transient ischemic attack (TIA), and cerebral infarction without  residual deficits   THERAPY DIAG:  Aphasia  Cerebrovascular accident (CVA) due to occlusion of left middle cerebral artery (HCC)  Apraxia  Cognitive communication deficit  Rationale for Evaluation and Treatment Rehabilitation  SUBJECTIVE:   SUBJECTIVE STATEMENT: Pt pleasant, eager, accompanied by a friend Pt accompanied by: friend  PERTINENT HISTORY: Pt is an 82 year old right handed male with past medical history of L MCA CVA on 05/22/2022 when he was admited to [facility] d/t global aphasia and confusion. 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 In addition, pt with history of T2DM, colonic polyps, and recurrent prostate adenocarcinoma.    DIAGNOSTIC FINDINGS:  05/23/2022 MRI BRAIN W WO CONTRAST IMPRESSION: Expected evolution of multifocal infarcts.  No evidence of underlying mass lesion or abnormal enhancement.   05/22/2022 MRI BRAIN WO CONTRAST IMPRESSION: Acute infarction of the left frontal, insula, and anterior temporal pole. Additional punctate infarctions in the right frontal and parietal lobe.  Right ethmoid air cell mucocele, when compared to recent CT there is probable dehiscence of the lamina papyracea. Further evaluation with CT sinus can be considered for further characterization as clinically indicated.   05/22/2022 CTA HEAD W CONTRAST IMPRESSION: No acute intracranial hemorrhage or mass. There is an abrupt cut off of 1 of the anterior left M3 segment near the left frontal corona radiata (series 605 image 78) with  associated punctate radiodensity on series 3 image 18 which may represent embolic calcified material. There is an adjacent hypodensity within the left frontal corona radiata which may be acute. The osseous structures are unremarkable.  No large vessel occlusion or stenosis. No aneurysm visualized.    PAIN:  Are you having pain? No  FALLS: Has patient fallen in last 6 months?  No  LIVING ENVIRONMENT: Lives with: living with a friend while  he is getting better Lives in: House/apartment  PLOF:  Level of assistance: Independent with ADLs, Independent with IADLs Employment: Retired   PATIENT GOALS    to improve expressive communication   OBJECTIVE:  COGNITION: Overall cognitive status: Difficulty to assess due to: Communication impairment and severity of deficits  AUDITORY COMPREHENSION: Overall auditory comprehension: Appears intact YES/NO questions: Appears intact Following directions: Appears intact Conversation: Moderately Complex   READING COMPREHENSION: Intact  EXPRESSION: verbal  VERBAL EXPRESSION: Overall verbal expression: Impaired: moderately complex Level of generative/spontaneous verbalization: phrase Automatic speech: name: intact and social response: intact  Repetition: Appears intact Naming: Responsive: 76-100%, Confrontation: 76-100%, Convergent: 76-100%, and Divergent: 0-25% Pragmatics: Appears intact Effective technique: open ended questions, semantic cues, and sentence completion Non-verbal means of communication: N/A  WRITTEN EXPRESSION: Dominant hand: right  Written expression: Appears intact  MOTOR SPEECH: Overall motor speech: Appears intact Respiration: diaphragmatic/abdominal breathing Phonation: normal Resonance: WFL Articulation: Appears intact Intelligibility: Intelligible Motor planning: Appears intact   ORAL MOTOR EXAMINATION Facial : WFL Lingual: WFL Velum: WFL Mandible: WFL Cough: WFL Voice: WFL   STANDARDIZED ASSESSMENTS:   Western Aphasia Battery- Revised  Spontaneous Speech                           Information content               5/10                                            Fluency                                 6/10                                          Comprehension     Yes/No questions                 60/60                                           Auditory Word Recognition  57/60                                Sequential Commands        72/80                              Repetition  90/100                                        Naming    Object Naming                     60/60                                           Word Fluency                        4/20                                            Sentence Completion          10/10                                             Responsive Speech              10/10                                         Aphasia Quotient                  76.5/100         Pt's severity rating was mild as indicated by an Aphasia Quotient of 76.5  (76 and above is mild). Pt's presentation is most consistent with anomic subtype, characterized by halting vague circumulatory verbal expression, intact auditory comprehension, and intact repetition. Reading and writing are strengths.     PATIENT REPORTED OUTCOME MEASURES (PROM): To be completed over the next 3 sessions   TODAY'S TREATMENT:  N/A   PATIENT EDUCATION: Education details: results of this assessment and ST POC Person educated: Patient and friend Education method: Explanation Education comprehension: verbalized understanding and needs further education  HOME EXERCISE PROGRAM:   N/A    GOALS:  Goals reviewed with patient? Yes  SHORT TERM GOALS: Target date: 10 sessions  With Min A, patient will complete a semantic feature analysis with at least 3 relevant features for 5/5 target words with minimal cues to improve word-finding skills.  Baseline: Goal status: INITIAL  2.  With Min A, pt will list 10 items in semi-complex category.  Baseline:  Goal status: INITIAL  3.  With Min A, patient will name abstract words and phrases from description at 90% accuracy.   Baseline:  Goal status: INITIAL   LONG TERM GOALS: Target date: 05/04/2023  With Rare Min A, patient will generate sentences with 3 or more words in response to a situation at 80% accuracy in order to increase ability to communicate  basic wants and needs.  Baseline:  Goal status: INITIAL  2.  With Supervision A, patient will use a circumlocution strategy, writing, drawing, and/or gesturing to describe target words with 90% accuracy to improve word-finding and reduce communication breakdowns, Baseline:  Goal status:  INITIAL  3.  With Supervision A, patient/family will demonstrate understanding of the following concepts: aphasia, spontaneous recovery, communication vs conversation, strengths/strategies to promote success, local resources by answering multiple choice questions with 80% accuracy when provided supported conversation in order to increase patient's participation in medical care.  Baseline:  Goal status: INITIAL   ASSESSMENT:  CLINICAL IMPRESSION: Patient is a 82 y.o. right handed male who was seen today for an aphasia evaluation. Pt presents with mild anomia aphasia. Pt benefits from repetition of verbal information d/t presumed hearing loss. Pt interested in having his hearing assessed, information provided on local audiology. Receptive, reading and writing abilities as well as speech intelligibility are strengths for pt.   OBJECTIVE IMPAIRMENTS include expressive language. These impairments are limiting patient from managing medications, managing appointments, managing finances, household responsibilities, ADLs/IADLs, and effectively communicating at home and in community. Factors affecting potential to achieve goals and functional outcome are  N/A . Patient will benefit from skilled SLP services to address above impairments and improve overall function.  REHAB POTENTIAL: Good  PLAN: SLP FREQUENCY: 1-2x/week  SLP DURATION: 12 weeks  PLANNED INTERVENTIONS: Language facilitation, Functional tasks, Multimodal communication approach, SLP instruction and feedback, Compensatory strategies, and Patient/family education    Maxie Debose B. Dreama Saa, M.S., CCC-SLP, Tree surgeon Certified Brain  Injury Specialist [facility]  [facility] Services Office [phone] Ascom [phone] Fax [phone]

## 2023-02-14 ENCOUNTER — Ambulatory Visit: Payer: Medicare HMO | Admitting: Speech Pathology

## 2023-02-14 DIAGNOSIS — I63512 Cerebral infarction due to unspecified occlusion or stenosis of left middle cerebral artery: Secondary | ICD-10-CM

## 2023-02-14 DIAGNOSIS — R4701 Aphasia: Secondary | ICD-10-CM

## 2023-02-14 DIAGNOSIS — R482 Apraxia: Secondary | ICD-10-CM | POA: Diagnosis not present

## 2023-02-14 DIAGNOSIS — R41841 Cognitive communication deficit: Secondary | ICD-10-CM | POA: Diagnosis not present

## 2023-02-14 NOTE — Therapy (Signed)
OUTPATIENT SPEECH LANGUAGE PATHOLOGY  APHASIA TREATMENT NOTE   Patient Name: Lawrence Wong MRN: [id number] DOB:April 15, 1941, 82 y.o., male Today's Date: 02/14/2023  PCP: Mort Sawyers, FNP REFERRING PROVIDER: Mort Sawyers, FNP   End of Session - 02/14/23 1109     Visit Number 2    Number of Visits 25    Date for SLP Re-Evaluation 05/04/23    Authorization Type [employer] Medicare HMO/PPO    Progress Note Due on Visit 10    SLP Start Time 1100    SLP Stop Time  1145    SLP Time Calculation (min) 45 min    Activity Tolerance Patient tolerated treatment well             Past Medical History:  Diagnosis Date   Allergy    Arthritis    Asthma    Chicken pox    Chronic kidney disease    Colon polyps    Diabetes mellitus without complication (HCC)    GERD (gastroesophageal reflux disease)    Heart murmur    Hyperlipidemia    Hypertension    Prostate cancer (HCC)    Syphilis    Past Surgical History:  Procedure Laterality Date   INGUINAL LYMPH NODE BIOPSY     PROSTATE BIOPSY     Patient Active Problem List   Diagnosis Date Noted   Primary hypertension 10/18/2022   Cerebrovascular accident (CVA) due to occlusion of left middle cerebral artery (HCC) 07/19/2022   Iron deficiency anemia 07/19/2022   Mucocele of nasal sinus 07/19/2022   Ischemic stroke (HCC) 05/22/2022   Type 2 diabetes mellitus (HCC) 05/22/2022   Hematuria 05/07/2022   Prediabetes 01/13/2022   Recurrent prostate adenocarcinoma (HCC) 06/13/2020   History of colonic polyps 06/12/2020   Gastro-esophageal reflux disease without esophagitis 06/12/2020   Atherosclerosis of aorta (HCC) 01/10/2020   Mild cognitive disorder 07/26/2019   Hyperlipidemia 07/09/2019   Arthritis 07/09/2019   Malignant neoplasm of prostate (HCC) 02/09/2018    ONSET DATE: 05/22/2022; Date of referral  11/24/2022  REFERRING DIAG: Z86.73 (ICD-10-CM) - Personal history of transient ischemic attack (TIA), and cerebral infarction  without residual deficits   THERAPY DIAG:  Aphasia  Cerebrovascular accident (CVA) due to occlusion of left middle cerebral artery (HCC)  Rationale for Evaluation and Treatment Rehabilitation  SUBJECTIVE:   PERTINENT HISTORY: Pt is an 82 year old right handed male with past medical history of L MCA CVA on 05/22/2022 when he was admited to [facility] d/t global aphasia and confusion. 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 In addition, pt with history of T2DM, colonic polyps, and recurrent prostate adenocarcinoma.    DIAGNOSTIC FINDINGS:  05/23/2022 MRI BRAIN W WO CONTRAST IMPRESSION: Expected evolution of multifocal infarcts.  No evidence of underlying mass lesion or abnormal enhancement.   05/22/2022 MRI BRAIN WO CONTRAST IMPRESSION: Acute infarction of the left frontal, insula, and anterior temporal pole. Additional punctate infarctions in the right frontal and parietal lobe.  Right ethmoid air cell mucocele, when compared to recent CT there is probable dehiscence of the lamina papyracea. Further evaluation with CT sinus can be considered for further characterization as clinically indicated.   05/22/2022 CTA HEAD W CONTRAST IMPRESSION: No acute intracranial hemorrhage or mass. There is an abrupt cut off of 1 of the anterior left M3 segment near the left frontal corona radiata (series 605 image 78) with associated punctate radiodensity on series 3 image 18 which may represent embolic calcified material. There is an adjacent  hypodensity within the left frontal corona radiata which may be acute. The osseous structures are unremarkable.  No large vessel occlusion or stenosis. No aneurysm visualized.    PAIN:  Are you having pain? No  FALLS: Has patient fallen in last 6 months?  No  LIVING ENVIRONMENT: Lives with: living with a friend while he is getting better Lives in: House/apartment  PLOF:  Level of assistance: Independent with ADLs, Independent with  IADLs Employment: Retired   PATIENT GOALS    to improve expressive communication   SUBJECTIVE STATEMENT: Pt pleasant, eager, accompanied by a friend who remained in the lobby Pt accompanied by: friend  OBJECTIVE:  TODAY'S TREATMENT:  Skilled treatment session focused on pt's aphasia goals. SLP facilitated session by providing the following interventions:  Maximal multimodal hierarchical cues to name 5 items within basic categories (fruit, transportation, vegetables, furniture in living room and bedroom) d/t verbal perseveration and anomia   PATIENT EDUCATION: Education details: word finding deficits Person educated: Patient Education method: Explanation Education comprehension: verbalized understanding and needs further education  HOME EXERCISE PROGRAM:   N/A    GOALS:  Goals reviewed with patient? Yes  SHORT TERM GOALS: Target date: 10 sessions  With Min A, patient will complete a semantic feature analysis with at least 3 relevant features for 5/5 target words with minimal cues to improve word-finding skills.  Baseline: Goal status: INITIAL  2.  With Min A, pt will list 10 items in semi-complex category.  Baseline:  Goal status: INITIAL  3.  With Min A, patient will name abstract words and phrases from description at 90% accuracy.   Baseline:  Goal status: INITIAL   LONG TERM GOALS: Target date: 05/04/2023  With Rare Min A, patient will generate sentences with 3 or more words in response to a situation at 80% accuracy in order to increase ability to communicate basic wants and needs.  Baseline:  Goal status: INITIAL  2.  With Supervision A, patient will use a circumlocution strategy, writing, drawing, and/or gesturing to describe target words with 90% accuracy to improve word-finding and reduce communication breakdowns, Baseline:  Goal status: INITIAL  3.  With Supervision A, patient/family will demonstrate understanding of the following concepts: aphasia,  spontaneous recovery, communication vs conversation, strengths/strategies to promote success, local resources by answering multiple choice questions with 80% accuracy when provided supported conversation in order to increase patient's participation in medical care.  Baseline:  Goal status: INITIAL   ASSESSMENT:  CLINICAL IMPRESSION: Patient is a 82 y.o. 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 While pt was eager to participate, he struggled with listing items in categories d/t significant word finding deficits c/b semantic paraphasias and perseverative responses. See the above treatment note for details.   OBJECTIVE IMPAIRMENTS include expressive language. These impairments are limiting patient from managing medications, managing appointments, managing finances, household responsibilities, ADLs/IADLs, and effectively communicating at home and in community. Factors affecting potential to achieve goals and functional outcome are  N/A . Patient will benefit from skilled SLP services to address above impairments and improve overall function.  REHAB POTENTIAL: Good  PLAN: SLP FREQUENCY: 1-2x/week  SLP DURATION: 12 weeks  PLANNED INTERVENTIONS: Language facilitation, Functional tasks, Multimodal communication approach, SLP instruction and feedback, Compensatory strategies, and Patient/family education    Yanil Dawe B. Dreama Saa, M.S., CCC-SLP, Tree surgeon Certified Brain Injury Specialist [facility]  [facility] Rehabilitation Services Office [phone] Ascom [phone]  Fax [phone]

## 2023-02-16 ENCOUNTER — Ambulatory Visit: Payer: Medicare HMO | Admitting: Speech Pathology

## 2023-02-16 DIAGNOSIS — R4701 Aphasia: Secondary | ICD-10-CM | POA: Diagnosis not present

## 2023-02-16 DIAGNOSIS — I63512 Cerebral infarction due to unspecified occlusion or stenosis of left middle cerebral artery: Secondary | ICD-10-CM

## 2023-02-16 DIAGNOSIS — R482 Apraxia: Secondary | ICD-10-CM | POA: Diagnosis not present

## 2023-02-16 DIAGNOSIS — R41841 Cognitive communication deficit: Secondary | ICD-10-CM | POA: Diagnosis not present

## 2023-02-16 NOTE — Therapy (Signed)
OUTPATIENT SPEECH LANGUAGE PATHOLOGY  APHASIA TREATMENT NOTE   Patient Name: Lawrence Wong MRN: [id number] DOB:02-Jan-1941, 82 y.o., male Today's Date: 02/16/2023  PCP: Mort Sawyers, FNP REFERRING PROVIDER: Mort Sawyers, FNP   End of Session - 02/16/23 1242     Visit Number 3    Number of Visits 25    Date for SLP Re-Evaluation 05/04/23    Authorization Type [employer] Medicare HMO/PPO    Progress Note Due on Visit 10    SLP Start Time 1100    SLP Stop Time  1145    SLP Time Calculation (min) 45 min    Activity Tolerance Patient tolerated treatment well             Past Medical History:  Diagnosis Date   Allergy    Arthritis    Asthma    Chicken pox    Chronic kidney disease    Colon polyps    Diabetes mellitus without complication (HCC)    GERD (gastroesophageal reflux disease)    Heart murmur    Hyperlipidemia    Hypertension    Prostate cancer (HCC)    Syphilis    Past Surgical History:  Procedure Laterality Date   INGUINAL LYMPH NODE BIOPSY     PROSTATE BIOPSY     Patient Active Problem List   Diagnosis Date Noted   Primary hypertension 10/18/2022   Cerebrovascular accident (CVA) due to occlusion of left middle cerebral artery (HCC) 07/19/2022   Iron deficiency anemia 07/19/2022   Mucocele of nasal sinus 07/19/2022   Ischemic stroke (HCC) 05/22/2022   Type 2 diabetes mellitus (HCC) 05/22/2022   Hematuria 05/07/2022   Prediabetes 01/13/2022   Recurrent prostate adenocarcinoma (HCC) 06/13/2020   History of colonic polyps 06/12/2020   Gastro-esophageal reflux disease without esophagitis 06/12/2020   Atherosclerosis of aorta (HCC) 01/10/2020   Mild cognitive disorder 07/26/2019   Hyperlipidemia 07/09/2019   Arthritis 07/09/2019   Malignant neoplasm of prostate (HCC) 02/09/2018    ONSET DATE: 05/22/2022; Date of referral  11/24/2022  REFERRING DIAG: Z86.73 (ICD-10-CM) - Personal history of transient ischemic attack (TIA), and cerebral infarction  without residual deficits   THERAPY DIAG:  Aphasia  Cerebrovascular accident (CVA) due to occlusion of left middle cerebral artery (HCC)  Rationale for Evaluation and Treatment Rehabilitation  SUBJECTIVE:   PERTINENT HISTORY: Pt is an 82 year old right handed male with past medical history of L MCA CVA on 05/22/2022 when he was admited to [facility] d/t global aphasia and confusion. 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 In addition, pt with history of T2DM, colonic polyps, and recurrent prostate adenocarcinoma.    DIAGNOSTIC FINDINGS:  05/23/2022 MRI BRAIN W WO CONTRAST IMPRESSION: Expected evolution of multifocal infarcts.  No evidence of underlying mass lesion or abnormal enhancement.   05/22/2022 MRI BRAIN WO CONTRAST IMPRESSION: Acute infarction of the left frontal, insula, and anterior temporal pole. Additional punctate infarctions in the right frontal and parietal lobe.  Right ethmoid air cell mucocele, when compared to recent CT there is probable dehiscence of the lamina papyracea. Further evaluation with CT sinus can be considered for further characterization as clinically indicated.   05/22/2022 CTA HEAD W CONTRAST IMPRESSION: No acute intracranial hemorrhage or mass. There is an abrupt cut off of 1 of the anterior left M3 segment near the left frontal corona radiata (series 605 image 78) with associated punctate radiodensity on series 3 image 18 which may represent embolic calcified material. There is an adjacent  hypodensity within the left frontal corona radiata which may be acute. The osseous structures are unremarkable.  No large vessel occlusion or stenosis. No aneurysm visualized.    PAIN:  Are you having pain? No  FALLS: Has patient fallen in last 6 months?  No  LIVING ENVIRONMENT: Lives with: living with a friend while he is getting better Lives in: House/apartment  PLOF:  Level of assistance: Independent with ADLs, Independent with  IADLs Employment: Retired   PATIENT GOALS    to improve expressive communication   SUBJECTIVE STATEMENT: Pt was originally scheduled for 10:15 but arrived at 11am (same time as previous appts) - fortunately this SLP had availability Pt accompanied by: friend  OBJECTIVE:  TODAY'S TREATMENT:  Skilled treatment session focused on pt's aphasia goals. SLP facilitated session by providing the following interventions:  .[place] Naming Test  [employer] was administered. Pt scored 42/60. This is 1 SD below norm of 48.9 for age range of 62-79.    Number of spontaneously given correct responses: 42 Number of multiple choices given: 18 Number of correct choices: 6  SLP further facilitated session by utilizing TalkPath Therapy Path  Language Speaking: Flash card naming nouns - Level 2 - 17 out of 20 improving to 18 out of 20 with multiple choices Speaking: Flash card naming nouns - Level 3 - 14 out of 20 improving to 17 out of 20 with multiple choices  Reading Sentence completion  Level 1: 16 out of 20 - pt observed with great reading ability      PATIENT EDUCATION: Education details: word finding deficits Person educated: Patient Education method: Explanation Education comprehension: verbalized understanding and needs further education  HOME EXERCISE PROGRAM:   N/A    GOALS:  Goals reviewed with patient? Yes  SHORT TERM GOALS: Target date: 10 sessions  With Min A, patient will complete a semantic feature analysis with at least 3 relevant features for 5/5 target words with minimal cues to improve word-finding skills.  Baseline: Goal status: INITIAL  2.  With Min A, pt will list 10 items in semi-complex category.  Baseline:  Goal status: INITIAL  3.  With Min A, patient will name abstract words and phrases from description at 90% accuracy.   Baseline:  Goal status: INITIAL   LONG TERM GOALS: Target date: 05/04/2023  With Rare Min A, patient will  generate sentences with 3 or more words in response to a situation at 80% accuracy in order to increase ability to communicate basic wants and needs.  Baseline:  Goal status: INITIAL  2.  With Supervision A, patient will use a circumlocution strategy, writing, drawing, and/or gesturing to describe target words with 90% accuracy to improve word-finding and reduce communication breakdowns, Baseline:  Goal status: INITIAL  3.  With Supervision A, patient/family will demonstrate understanding of the following concepts: aphasia, spontaneous recovery, communication vs conversation, strengths/strategies to promote success, local resources by answering multiple choice questions with 80% accuracy when provided supported conversation in order to increase patient's participation in medical care.  Baseline:  Goal status: INITIAL   ASSESSMENT:  CLINICAL IMPRESSION: Patient is a 82 y.o. 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 While pt was eager to participate, he struggled with listing items in categories d/t significant word finding deficits c/b semantic paraphasias and perseverative responses. See the above treatment note for details.   OBJECTIVE IMPAIRMENTS include expressive language. These impairments are  limiting patient from managing medications, managing appointments, managing finances, household responsibilities, ADLs/IADLs, and effectively communicating at home and in community. Factors affecting potential to achieve goals and functional outcome are  N/A . Patient will benefit from skilled SLP services to address above impairments and improve overall function.  REHAB POTENTIAL: Good  PLAN: SLP FREQUENCY: 1-2x/week  SLP DURATION: 12 weeks  PLANNED INTERVENTIONS: Language facilitation, Functional tasks, Multimodal communication approach, SLP instruction and feedback, Compensatory strategies, and Patient/family  education    Teleshia Lemere B. Dreama Saa, M.S., CCC-SLP, Tree surgeon Certified Brain Injury Specialist [facility]  [facility] Rehabilitation Services Office [phone] Ascom [phone] Fax [phone]

## 2023-02-21 ENCOUNTER — Ambulatory Visit: Payer: Medicare HMO | Admitting: Speech Pathology

## 2023-02-21 DIAGNOSIS — I63512 Cerebral infarction due to unspecified occlusion or stenosis of left middle cerebral artery: Secondary | ICD-10-CM | POA: Diagnosis not present

## 2023-02-21 DIAGNOSIS — R4701 Aphasia: Secondary | ICD-10-CM

## 2023-02-21 DIAGNOSIS — R41841 Cognitive communication deficit: Secondary | ICD-10-CM | POA: Diagnosis not present

## 2023-02-21 DIAGNOSIS — R482 Apraxia: Secondary | ICD-10-CM | POA: Diagnosis not present

## 2023-02-21 NOTE — Therapy (Signed)
OUTPATIENT SPEECH LANGUAGE PATHOLOGY  APHASIA TREATMENT NOTE   Patient Name: Truett Liefer MRN: [id number] DOB:07-27-1940, 82 y.o., male Today's Date: 02/21/2023  PCP: Mort Sawyers, FNP REFERRING PROVIDER: Mort Sawyers, FNP   End of Session - 02/21/23 1356     Visit Number 4    Number of Visits 25    Date for SLP Re-Evaluation 05/04/23    Authorization Type [employer] Medicare HMO/PPO    Progress Note Due on Visit 10    SLP Start Time 1315    SLP Stop Time  1400    SLP Time Calculation (min) 45 min    Activity Tolerance Patient tolerated treatment well             Past Medical History:  Diagnosis Date   Allergy    Arthritis    Asthma    Chicken pox    Chronic kidney disease    Colon polyps    Diabetes mellitus without complication (HCC)    GERD (gastroesophageal reflux disease)    Heart murmur    Hyperlipidemia    Hypertension    Prostate cancer (HCC)    Syphilis    Past Surgical History:  Procedure Laterality Date   INGUINAL LYMPH NODE BIOPSY     PROSTATE BIOPSY     Patient Active Problem List   Diagnosis Date Noted   Primary hypertension 10/18/2022   Cerebrovascular accident (CVA) due to occlusion of left middle cerebral artery (HCC) 07/19/2022   Iron deficiency anemia 07/19/2022   Mucocele of nasal sinus 07/19/2022   Ischemic stroke (HCC) 05/22/2022   Type 2 diabetes mellitus (HCC) 05/22/2022   Hematuria 05/07/2022   Prediabetes 01/13/2022   Recurrent prostate adenocarcinoma (HCC) 06/13/2020   History of colonic polyps 06/12/2020   Gastro-esophageal reflux disease without esophagitis 06/12/2020   Atherosclerosis of aorta (HCC) 01/10/2020   Mild cognitive disorder 07/26/2019   Hyperlipidemia 07/09/2019   Arthritis 07/09/2019   Malignant neoplasm of prostate (HCC) 02/09/2018    ONSET DATE: 05/22/2022; Date of referral  11/24/2022  REFERRING DIAG: Z86.73 (ICD-10-CM) - Personal history of transient ischemic attack (TIA), and cerebral infarction  without residual deficits   THERAPY DIAG:  Aphasia  Cerebrovascular accident (CVA) due to occlusion of left middle cerebral artery (HCC)  Rationale for Evaluation and Treatment Rehabilitation  SUBJECTIVE:   PERTINENT HISTORY: Pt is an 82 year old right handed male with past medical history of L MCA CVA on 05/22/2022 when he was admited to [facility] d/t global aphasia and confusion. 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 In addition, pt with history of T2DM, colonic polyps, and recurrent prostate adenocarcinoma.    DIAGNOSTIC FINDINGS:  05/23/2022 MRI BRAIN W WO CONTRAST IMPRESSION: Expected evolution of multifocal infarcts.  No evidence of underlying mass lesion or abnormal enhancement.   05/22/2022 MRI BRAIN WO CONTRAST IMPRESSION: Acute infarction of the left frontal, insula, and anterior temporal pole. Additional punctate infarctions in the right frontal and parietal lobe.  Right ethmoid air cell mucocele, when compared to recent CT there is probable dehiscence of the lamina papyracea. Further evaluation with CT sinus can be considered for further characterization as clinically indicated.   05/22/2022 CTA HEAD W CONTRAST IMPRESSION: No acute intracranial hemorrhage or mass. There is an abrupt cut off of 1 of the anterior left M3 segment near the left frontal corona radiata (series 605 image 78) with associated punctate radiodensity on series 3 image 18 which may represent embolic calcified material. There is an adjacent  hypodensity within the left frontal corona radiata which may be acute. The osseous structures are unremarkable.  No large vessel occlusion or stenosis. No aneurysm visualized.    PAIN:  Are you having pain? No  FALLS: Has patient fallen in last 6 months?  No  LIVING ENVIRONMENT: Lives with: living with a friend while he is getting better Lives in: House/apartment  PLOF:  Level of assistance: Independent with ADLs, Independent with  IADLs Employment: Retired   PATIENT GOALS    to improve expressive communication   SUBJECTIVE STATEMENT: Pt arrived on time today Pt accompanied by: friend  OBJECTIVE:  TODAY'S TREATMENT:  Skilled treatment session focused on pt's aphasia goals. SLP facilitated session by providing the following interventions:  .SLP trained semantic feature analysis (SFA) with visual aid. Initial training with SLP provided x5 words related to work tasks, patient provided x3 features with minimal to moderate contextual/questions cues to verbalize features. Patient improved to provided total x3 features in all opportunities with minimal A via questions and visual cues to provide a faciliator (feature) word for word finding. SLP modeled SFA and provided x3 features for patient to complete word finding--100% accuracy demonstrated.    PATIENT EDUCATION: Education details: word finding deficits Person educated: Patient Education method: Explanation Education comprehension: verbalized understanding and needs further education  HOME EXERCISE PROGRAM:   N/A    GOALS:  Goals reviewed with patient? Yes  SHORT TERM GOALS: Target date: 10 sessions  With Min A, patient will complete a semantic feature analysis with at least 3 relevant features for 5/5 target words with minimal cues to improve word-finding skills.  Baseline: Goal status: INITIAL  2.  With Min A, pt will list 10 items in semi-complex category.  Baseline:  Goal status: INITIAL  3.  With Min A, patient will name abstract words and phrases from description at 90% accuracy.   Baseline:  Goal status: INITIAL   LONG TERM GOALS: Target date: 05/04/2023  With Rare Min A, patient will generate sentences with 3 or more words in response to a situation at 80% accuracy in order to increase ability to communicate basic wants and needs.  Baseline:  Goal status: INITIAL  2.  With Supervision A, patient will use a circumlocution strategy,  writing, drawing, and/or gesturing to describe target words with 90% accuracy to improve word-finding and reduce communication breakdowns, Baseline:  Goal status: INITIAL  3.  With Supervision A, patient/family will demonstrate understanding of the following concepts: aphasia, spontaneous recovery, communication vs conversation, strengths/strategies to promote success, local resources by answering multiple choice questions with 80% accuracy when provided supported conversation in order to increase patient's participation in medical care.  Baseline:  Goal status: INITIAL   ASSESSMENT:  CLINICAL IMPRESSION: Patient is a 82 y.o. 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 While pt was eager to participate, he struggled with listing items in categories d/t significant word finding deficits c/b semantic paraphasias and perseverative responses. Pt with improved language response to use of Semantic Feature Analysis. See the above treatment note for details.   OBJECTIVE IMPAIRMENTS include expressive language. These impairments are limiting patient from managing medications, managing appointments, managing finances, household responsibilities, ADLs/IADLs, and effectively communicating at home and in community. Factors affecting potential to achieve goals and functional outcome are  N/A . Patient will benefit from skilled SLP services to address above impairments and improve overall function.  REHAB POTENTIAL: Good  PLAN:  SLP FREQUENCY: 1-2x/week  SLP DURATION: 12 weeks  PLANNED INTERVENTIONS: Language facilitation, Functional tasks, Multimodal communication approach, SLP instruction and feedback, Compensatory strategies, and Patient/family education    Makinzee Durley B. Dreama Saa, M.S., CCC-SLP, Tree surgeon Certified Brain Injury Specialist [facility]  [facility] Rehabilitation Services Office  [phone] Ascom [phone] Fax [phone]

## 2023-02-23 ENCOUNTER — Ambulatory Visit: Payer: Medicare HMO | Admitting: Speech Pathology

## 2023-02-23 DIAGNOSIS — I63512 Cerebral infarction due to unspecified occlusion or stenosis of left middle cerebral artery: Secondary | ICD-10-CM

## 2023-02-23 DIAGNOSIS — R482 Apraxia: Secondary | ICD-10-CM | POA: Diagnosis not present

## 2023-02-23 DIAGNOSIS — R4701 Aphasia: Secondary | ICD-10-CM

## 2023-02-23 DIAGNOSIS — R41841 Cognitive communication deficit: Secondary | ICD-10-CM | POA: Diagnosis not present

## 2023-02-23 NOTE — Therapy (Signed)
OUTPATIENT SPEECH LANGUAGE PATHOLOGY  APHASIA TREATMENT NOTE   Patient Name: Lawrence Wong MRN: [id number] DOB:08-19-1940, 82 y.o., male Today's Date: 02/23/2023  PCP: Mort Sawyers, FNP REFERRING PROVIDER: Mort Sawyers, FNP   End of Session - 02/23/23 1330     Visit Number 5    Number of Visits 25    Date for SLP Re-Evaluation 05/04/23    Authorization Type [employer] Medicare HMO/PPO    Progress Note Due on Visit 10    SLP Start Time 1315    SLP Stop Time  1400    SLP Time Calculation (min) 45 min    Activity Tolerance Patient tolerated treatment well             Past Medical History:  Diagnosis Date   Allergy    Arthritis    Asthma    Chicken pox    Chronic kidney disease    Colon polyps    Diabetes mellitus without complication (HCC)    GERD (gastroesophageal reflux disease)    Heart murmur    Hyperlipidemia    Hypertension    Prostate cancer (HCC)    Syphilis    Past Surgical History:  Procedure Laterality Date   INGUINAL LYMPH NODE BIOPSY     PROSTATE BIOPSY     Patient Active Problem List   Diagnosis Date Noted   Primary hypertension 10/18/2022   Cerebrovascular accident (CVA) due to occlusion of left middle cerebral artery (HCC) 07/19/2022   Iron deficiency anemia 07/19/2022   Mucocele of nasal sinus 07/19/2022   Ischemic stroke (HCC) 05/22/2022   Type 2 diabetes mellitus (HCC) 05/22/2022   Hematuria 05/07/2022   Prediabetes 01/13/2022   Recurrent prostate adenocarcinoma (HCC) 06/13/2020   History of colonic polyps 06/12/2020   Gastro-esophageal reflux disease without esophagitis 06/12/2020   Atherosclerosis of aorta (HCC) 01/10/2020   Mild cognitive disorder 07/26/2019   Hyperlipidemia 07/09/2019   Arthritis 07/09/2019   Malignant neoplasm of prostate (HCC) 02/09/2018    ONSET DATE: 05/22/2022; Date of referral  11/24/2022  REFERRING DIAG: Z86.73 (ICD-10-CM) - Personal history of transient ischemic attack (TIA), and cerebral infarction  without residual deficits   THERAPY DIAG:  Aphasia  Cerebrovascular accident (CVA) due to occlusion of left middle cerebral artery (HCC)  Rationale for Evaluation and Treatment Rehabilitation  SUBJECTIVE:   PERTINENT HISTORY: Pt is an 82 year old right handed male with past medical history of L MCA CVA on 05/22/2022 when he was admited to [facility] d/t global aphasia and confusion. 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 In addition, pt with history of T2DM, colonic polyps, and recurrent prostate adenocarcinoma.    DIAGNOSTIC FINDINGS:  05/23/2022 MRI BRAIN W WO CONTRAST IMPRESSION: Expected evolution of multifocal infarcts.  No evidence of underlying mass lesion or abnormal enhancement.   05/22/2022 MRI BRAIN WO CONTRAST IMPRESSION: Acute infarction of the left frontal, insula, and anterior temporal pole. Additional punctate infarctions in the right frontal and parietal lobe.  Right ethmoid air cell mucocele, when compared to recent CT there is probable dehiscence of the lamina papyracea. Further evaluation with CT sinus can be considered for further characterization as clinically indicated.   05/22/2022 CTA HEAD W CONTRAST IMPRESSION: No acute intracranial hemorrhage or mass. There is an abrupt cut off of 1 of the anterior left M3 segment near the left frontal corona radiata (series 605 image 78) with associated punctate radiodensity on series 3 image 18 which may represent embolic calcified material. There is an adjacent  hypodensity within the left frontal corona radiata which may be acute. The osseous structures are unremarkable.  No large vessel occlusion or stenosis. No aneurysm visualized.    PAIN:  Are you having pain? No  FALLS: Has patient fallen in last 6 months?  No  LIVING ENVIRONMENT: Lives with: living with a friend while he is getting better Lives in: House/apartment  PLOF:  Level of assistance: Independent with ADLs, Independent with  IADLs Employment: Retired   PATIENT GOALS    to improve expressive communication   SUBJECTIVE STATEMENT: Pt continues to be eager, talking about recent thunderstorms that we had in the community Pt accompanied by: friend  OBJECTIVE:  TODAY'S TREATMENT:  Skilled treatment session focused on pt's aphasia goals. SLP facilitated session by providing the following interventions:  .SLP trained semantic feature analysis (SFA) with visual aid. Initial training with SLP provided x5 words related to work tasks, patient provided x3 features with minimal contextual/questions cues to verbalize features. Patient improved to provided total x3 features in all opportunities with minimal A via questions and visual cues to provide a faciliator (feature) word for word finding. SLP modeled SFA and provided x3 features for patient to complete word finding--75% accuracy demonstrated.    PATIENT EDUCATION: Education details: word finding deficits Person educated: Patient Education method: Explanation Education comprehension: verbalized understanding and needs further education  HOME EXERCISE PROGRAM:   N/A    GOALS:  Goals reviewed with patient? Yes  SHORT TERM GOALS: Target date: 10 sessions  With Min A, patient will complete a semantic feature analysis with at least 3 relevant features for 5/5 target words with minimal cues to improve word-finding skills.  Baseline: Goal status: INITIAL  2.  With Min A, pt will list 10 items in semi-complex category.  Baseline:  Goal status: INITIAL  3.  With Min A, patient will name abstract words and phrases from description at 90% accuracy.   Baseline:  Goal status: INITIAL   LONG TERM GOALS: Target date: 05/04/2023  With Rare Min A, patient will generate sentences with 3 or more words in response to a situation at 80% accuracy in order to increase ability to communicate basic wants and needs.  Baseline:  Goal status: INITIAL  2.  With Supervision  A, patient will use a circumlocution strategy, writing, drawing, and/or gesturing to describe target words with 90% accuracy to improve word-finding and reduce communication breakdowns, Baseline:  Goal status: INITIAL  3.  With Supervision A, patient/family will demonstrate understanding of the following concepts: aphasia, spontaneous recovery, communication vs conversation, strengths/strategies to promote success, local resources by answering multiple choice questions with 80% accuracy when provided supported conversation in order to increase patient's participation in medical care.  Baseline:  Goal status: INITIAL   ASSESSMENT:  CLINICAL IMPRESSION: Patient is a 82 y.o. 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 While pt was eager to participate, he struggled with listing items in categories d/t significant word finding deficits c/b semantic paraphasias and perseverative responses. Pt with continued improved language response to use of Semantic Feature Analysis. See the above treatment note for details.   OBJECTIVE IMPAIRMENTS include expressive language. These impairments are limiting patient from managing medications, managing appointments, managing finances, household responsibilities, ADLs/IADLs, and effectively communicating at home and in community. Factors affecting potential to achieve goals and functional outcome are  N/A . Patient will benefit from skilled SLP services to address above impairments and  improve overall function.  REHAB POTENTIAL: Good  PLAN: SLP FREQUENCY: 1-2x/week  SLP DURATION: 12 weeks  PLANNED INTERVENTIONS: Language facilitation, Functional tasks, Multimodal communication approach, SLP instruction and feedback, Compensatory strategies, and Patient/family education    Tamra Koos B. Dreama Saa, M.S., CCC-SLP, Tree surgeon Certified Brain Injury Specialist [facility]  [facility] Rehabilitation Services Office [phone] Ascom [phone] Fax [phone]

## 2023-02-28 ENCOUNTER — Ambulatory Visit: Payer: Medicare HMO | Admitting: Speech Pathology

## 2023-02-28 DIAGNOSIS — R4701 Aphasia: Secondary | ICD-10-CM | POA: Diagnosis not present

## 2023-02-28 DIAGNOSIS — R41841 Cognitive communication deficit: Secondary | ICD-10-CM | POA: Diagnosis not present

## 2023-02-28 DIAGNOSIS — I63512 Cerebral infarction due to unspecified occlusion or stenosis of left middle cerebral artery: Secondary | ICD-10-CM

## 2023-02-28 DIAGNOSIS — R482 Apraxia: Secondary | ICD-10-CM | POA: Diagnosis not present

## 2023-02-28 NOTE — Therapy (Signed)
OUTPATIENT SPEECH LANGUAGE PATHOLOGY  APHASIA TREATMENT NOTE   Patient Name: Lawrence Wong MRN: [id number] DOB:1941/01/08, 82 y.o., male Today's Date: 02/28/2023  PCP: Mort Sawyers, FNP REFERRING PROVIDER: Mort Sawyers, FNP   End of Session - 02/28/23 1316     Visit Number 6    Number of Visits 25    Date for SLP Re-Evaluation 05/04/23    Authorization Type [employer] Medicare HMO/PPO    Progress Note Due on Visit 10    SLP Start Time 1315    SLP Stop Time  1400    SLP Time Calculation (min) 45 min    Activity Tolerance Patient tolerated treatment well             Past Medical History:  Diagnosis Date   Allergy    Arthritis    Asthma    Chicken pox    Chronic kidney disease    Colon polyps    Diabetes mellitus without complication (HCC)    GERD (gastroesophageal reflux disease)    Heart murmur    Hyperlipidemia    Hypertension    Prostate cancer (HCC)    Syphilis    Past Surgical History:  Procedure Laterality Date   INGUINAL LYMPH NODE BIOPSY     PROSTATE BIOPSY     Patient Active Problem List   Diagnosis Date Noted   Primary hypertension 10/18/2022   Cerebrovascular accident (CVA) due to occlusion of left middle cerebral artery (HCC) 07/19/2022   Iron deficiency anemia 07/19/2022   Mucocele of nasal sinus 07/19/2022   Ischemic stroke (HCC) 05/22/2022   Type 2 diabetes mellitus (HCC) 05/22/2022   Hematuria 05/07/2022   Prediabetes 01/13/2022   Recurrent prostate adenocarcinoma (HCC) 06/13/2020   History of colonic polyps 06/12/2020   Gastro-esophageal reflux disease without esophagitis 06/12/2020   Atherosclerosis of aorta (HCC) 01/10/2020   Mild cognitive disorder 07/26/2019   Hyperlipidemia 07/09/2019   Arthritis 07/09/2019   Malignant neoplasm of prostate (HCC) 02/09/2018    ONSET DATE: 05/22/2022; Date of referral  11/24/2022  REFERRING DIAG: Z86.73 (ICD-10-CM) - Personal history of transient ischemic attack (TIA), and cerebral infarction  without residual deficits   THERAPY DIAG:  Aphasia  Cerebrovascular accident (CVA) due to occlusion of left middle cerebral artery (HCC)  Rationale for Evaluation and Treatment Rehabilitation  SUBJECTIVE:   PERTINENT HISTORY: Pt is an 82 year old right handed male with past medical history of L MCA CVA on 05/22/2022 when he was admited to [facility] d/t global aphasia and confusion. 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 In addition, pt with history of T2DM, colonic polyps, and recurrent prostate adenocarcinoma.    DIAGNOSTIC FINDINGS:  05/23/2022 MRI BRAIN W WO CONTRAST IMPRESSION: Expected evolution of multifocal infarcts.  No evidence of underlying mass lesion or abnormal enhancement.   05/22/2022 MRI BRAIN WO CONTRAST IMPRESSION: Acute infarction of the left frontal, insula, and anterior temporal pole. Additional punctate infarctions in the right frontal and parietal lobe.  Right ethmoid air cell mucocele, when compared to recent CT there is probable dehiscence of the lamina papyracea. Further evaluation with CT sinus can be considered for further characterization as clinically indicated.   05/22/2022 CTA HEAD W CONTRAST IMPRESSION: No acute intracranial hemorrhage or mass. There is an abrupt cut off of 1 of the anterior left M3 segment near the left frontal corona radiata (series 605 image 78) with associated punctate radiodensity on series 3 image 18 which may represent embolic calcified material. There is an adjacent  hypodensity within the left frontal corona radiata which may be acute. The osseous structures are unremarkable.  No large vessel occlusion or stenosis. No aneurysm visualized.    PAIN:  Are you having pain? No  FALLS: Has patient fallen in last 6 months?  No  LIVING ENVIRONMENT: Lives with: living with a friend while he is getting better Lives in: House/apartment  PLOF:  Level of assistance: Independent with ADLs, Independent with  IADLs Employment: Retired   PATIENT GOALS    to improve expressive communication   SUBJECTIVE STATEMENT: Pt talked about recent storm Pt accompanied by: friend  OBJECTIVE:  TODAY'S TREATMENT:  Skilled treatment session focused on pt's aphasia goals. SLP facilitated session by providing the following interventions:  TalkPath Therapy App was utilized to target pt's expressive language and reading abilities:  Sentence Scramble: Level 3 - 80% improving to 95% with minimal A  Sentence Completion - Level 1 - 75% d/t deficits in reading   PATIENT EDUCATION: Education details: word finding deficits Person educated: Patient Education method: Explanation Education comprehension: verbalized understanding and needs further education  HOME EXERCISE PROGRAM:   N/A    GOALS:  Goals reviewed with patient? Yes  SHORT TERM GOALS: Target date: 10 sessions  With Min A, patient will complete a semantic feature analysis with at least 3 relevant features for 5/5 target words with minimal cues to improve word-finding skills.  Baseline: Goal status: INITIAL  2.  With Min A, pt will list 10 items in semi-complex category.  Baseline:  Goal status: INITIAL  3.  With Min A, patient will name abstract words and phrases from description at 90% accuracy.   Baseline:  Goal status: INITIAL   LONG TERM GOALS: Target date: 05/04/2023  With Rare Min A, patient will generate sentences with 3 or more words in response to a situation at 80% accuracy in order to increase ability to communicate basic wants and needs.  Baseline:  Goal status: INITIAL  2.  With Supervision A, patient will use a circumlocution strategy, writing, drawing, and/or gesturing to describe target words with 90% accuracy to improve word-finding and reduce communication breakdowns, Baseline:  Goal status: INITIAL  3.  With Supervision A, patient/family will demonstrate understanding of the following concepts: aphasia,  spontaneous recovery, communication vs conversation, strengths/strategies to promote success, local resources by answering multiple choice questions with 80% accuracy when provided supported conversation in order to increase patient's participation in medical care.  Baseline:  Goal status: INITIAL   ASSESSMENT:  CLINICAL IMPRESSION: Patient is a 82 y.o. 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 While pt was eager to participate, he struggled with listing items in categories d/t significant word finding deficits c/b semantic paraphasias and perseverative responses. Pt with subjectively improved conversation regarding recent storm. See the above treatment note for details.   OBJECTIVE IMPAIRMENTS include expressive language. These impairments are limiting patient from managing medications, managing appointments, managing finances, household responsibilities, ADLs/IADLs, and effectively communicating at home and in community. Factors affecting potential to achieve goals and functional outcome are  N/A . Patient will benefit from skilled SLP services to address above impairments and improve overall function.  REHAB POTENTIAL: Good  PLAN: SLP FREQUENCY: 1-2x/week  SLP DURATION: 12 weeks  PLANNED INTERVENTIONS: Language facilitation, Functional tasks, Multimodal communication approach, SLP instruction and feedback, Compensatory strategies, and Patient/family education    Angelina Neece B. Dreama Saa, M.S., CCC-SLP, CBIS Speech-Language Pathologist Certified Brain Injury  Specialist [facility]  [facility] Rehabilitation Services Office [phone] Ascom [phone] Fax [phone]

## 2023-03-02 ENCOUNTER — Ambulatory Visit: Payer: Medicare HMO | Attending: Family | Admitting: Speech Pathology

## 2023-03-02 DIAGNOSIS — I63512 Cerebral infarction due to unspecified occlusion or stenosis of left middle cerebral artery: Secondary | ICD-10-CM | POA: Insufficient documentation

## 2023-03-02 DIAGNOSIS — R4701 Aphasia: Secondary | ICD-10-CM | POA: Diagnosis not present

## 2023-03-02 NOTE — Therapy (Signed)
OUTPATIENT SPEECH LANGUAGE PATHOLOGY  APHASIA TREATMENT NOTE   Patient Name: Lawrence Wong MRN: [id number] DOB:09/02/1940, 82 y.o., male Today's Date: 03/02/2023  PCP: Mort Sawyers, FNP REFERRING PROVIDER: Mort Sawyers, FNP   End of Session - 03/02/23 1315     Visit Number 7    Number of Visits 25    Date for SLP Re-Evaluation 05/04/23    Authorization Type [employer] Medicare HMO/PPO    Progress Note Due on Visit 10    SLP Start Time 1315    SLP Stop Time  1400    SLP Time Calculation (min) 45 min    Activity Tolerance Patient tolerated treatment well             Past Medical History:  Diagnosis Date   Allergy    Arthritis    Asthma    Chicken pox    Chronic kidney disease    Colon polyps    Diabetes mellitus without complication (HCC)    GERD (gastroesophageal reflux disease)    Heart murmur    Hyperlipidemia    Hypertension    Prostate cancer (HCC)    Syphilis    Past Surgical History:  Procedure Laterality Date   INGUINAL LYMPH NODE BIOPSY     PROSTATE BIOPSY     Patient Active Problem List   Diagnosis Date Noted   Primary hypertension 10/18/2022   Cerebrovascular accident (CVA) due to occlusion of left middle cerebral artery (HCC) 07/19/2022   Iron deficiency anemia 07/19/2022   Mucocele of nasal sinus 07/19/2022   Ischemic stroke (HCC) 05/22/2022   Type 2 diabetes mellitus (HCC) 05/22/2022   Hematuria 05/07/2022   Prediabetes 01/13/2022   Recurrent prostate adenocarcinoma (HCC) 06/13/2020   History of colonic polyps 06/12/2020   Gastro-esophageal reflux disease without esophagitis 06/12/2020   Atherosclerosis of aorta (HCC) 01/10/2020   Mild cognitive disorder 07/26/2019   Hyperlipidemia 07/09/2019   Arthritis 07/09/2019   Malignant neoplasm of prostate (HCC) 02/09/2018    ONSET DATE: 05/22/2022; Date of referral  11/24/2022  REFERRING DIAG: Z86.73 (ICD-10-CM) - Personal history of transient ischemic attack (TIA), and cerebral infarction  without residual deficits   THERAPY DIAG:  Aphasia  Cerebrovascular accident (CVA) due to occlusion of left middle cerebral artery (HCC)  Rationale for Evaluation and Treatment Rehabilitation  SUBJECTIVE:   PERTINENT HISTORY: Pt is an 82 year old right handed male with past medical history of L MCA CVA on 05/22/2022 when he was admited to [facility] d/t global aphasia and confusion. 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 In addition, pt with history of T2DM, colonic polyps, and recurrent prostate adenocarcinoma.    DIAGNOSTIC FINDINGS:  05/23/2022 MRI BRAIN W WO CONTRAST IMPRESSION: Expected evolution of multifocal infarcts.  No evidence of underlying mass lesion or abnormal enhancement.   05/22/2022 MRI BRAIN WO CONTRAST IMPRESSION: Acute infarction of the left frontal, insula, and anterior temporal pole. Additional punctate infarctions in the right frontal and parietal lobe.  Right ethmoid air cell mucocele, when compared to recent CT there is probable dehiscence of the lamina papyracea. Further evaluation with CT sinus can be considered for further characterization as clinically indicated.   05/22/2022 CTA HEAD W CONTRAST IMPRESSION: No acute intracranial hemorrhage or mass. There is an abrupt cut off of 1 of the anterior left M3 segment near the left frontal corona radiata (series 605 image 78) with associated punctate radiodensity on series 3 image 18 which may represent embolic calcified material. There is an adjacent  hypodensity within the left frontal corona radiata which may be acute. The osseous structures are unremarkable.  No large vessel occlusion or stenosis. No aneurysm visualized.    PAIN:  Are you having pain? No  FALLS: Has patient fallen in last 6 months?  No  LIVING ENVIRONMENT: Lives with: living with a friend while he is getting better Lives in: House/apartment  PLOF:  Level of assistance: Independent with ADLs, Independent with  IADLs Employment: Retired   PATIENT GOALS    to improve expressive communication   SUBJECTIVE STATEMENT: "I got my eardrums checked yesterday" Pt unable to provide additional details Pt accompanied by: friend who stayed in lobby  OBJECTIVE:  TODAY'S TREATMENT:  Skilled treatment session focused on pt's aphasia goals. SLP facilitated session by providing the following interventions:  Word finding deficits targeted thru vocabulary building using [facility] 1: Aphasia Rehab - Unit 3 Vocabulary [id number] targeting word associations. With Min A, pt able to ocmplete with 90% accuracy d/t semantic paraphasic errors.     PATIENT EDUCATION: Education details: word finding deficits Person educated: Patient Education method: Explanation Education comprehension: verbalized understanding and needs further education  HOME EXERCISE PROGRAM:   N/A    GOALS:  Goals reviewed with patient? Yes  SHORT TERM GOALS: Target date: 10 sessions  With Min A, patient will complete a semantic feature analysis with at least 3 relevant features for 5/5 target words with minimal cues to improve word-finding skills.  Baseline: Goal status: INITIAL  2.  With Min A, pt will list 10 items in semi-complex category.  Baseline:  Goal status: INITIAL  3.  With Min A, patient will name abstract words and phrases from description at 90% accuracy.   Baseline:  Goal status: INITIAL   LONG TERM GOALS: Target date: 05/04/2023  With Rare Min A, patient will generate sentences with 3 or more words in response to a situation at 80% accuracy in order to increase ability to communicate basic wants and needs.  Baseline:  Goal status: INITIAL  2.  With Supervision A, patient will use a circumlocution strategy, writing, drawing, and/or gesturing to describe target words with 90% accuracy to improve word-finding and reduce communication breakdowns, Baseline:  Goal status: INITIAL  3.  With Supervision A, patient/family  will demonstrate understanding of the following concepts: aphasia, spontaneous recovery, communication vs conversation, strengths/strategies to promote success, local resources by answering multiple choice questions with 80% accuracy when provided supported conversation in order to increase patient's participation in medical care.  Baseline:  Goal status: INITIAL   ASSESSMENT:  CLINICAL IMPRESSION: Patient is a 82 y.o. 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 While pt was eager to participate, he struggled with listing items in categories d/t significant word finding deficits c/b semantic paraphasias and perseverative responses. Pt largely able to communicate information regarding his appt with audiology. Information confirmed with his friend. Pt states that he is "going to think about getting the hearing aids for several days." Education provided on impact that hearing loss has on cognitive ability and post stroke recovery. See the above treatment note for details.   OBJECTIVE IMPAIRMENTS include expressive language. These impairments are limiting patient from managing medications, managing appointments, managing finances, household responsibilities, ADLs/IADLs, and effectively communicating at home and in community. Factors affecting potential to achieve goals and functional outcome are  N/A . Patient will benefit from skilled SLP services to address above impairments and improve overall  function.  REHAB POTENTIAL: Good  PLAN: SLP FREQUENCY: 1-2x/week  SLP DURATION: 12 weeks  PLANNED INTERVENTIONS: Language facilitation, Functional tasks, Multimodal communication approach, SLP instruction and feedback, Compensatory strategies, and Patient/family education    Kellyjo Edgren B. Dreama Saa, M.S., CCC-SLP, Tree surgeon Certified Brain Injury Specialist [facility]  [facility] Rehabilitation  Services Office [phone] Ascom [phone] Fax [phone]

## 2023-03-07 ENCOUNTER — Ambulatory Visit: Payer: Medicare HMO | Admitting: Speech Pathology

## 2023-03-07 DIAGNOSIS — I63512 Cerebral infarction due to unspecified occlusion or stenosis of left middle cerebral artery: Secondary | ICD-10-CM | POA: Diagnosis not present

## 2023-03-07 DIAGNOSIS — R4701 Aphasia: Secondary | ICD-10-CM

## 2023-03-07 NOTE — Therapy (Signed)
OUTPATIENT SPEECH LANGUAGE PATHOLOGY  APHASIA TREATMENT NOTE   Patient Name: Lawrence Wong MRN: [id number] DOB:April 26, 1941, 82 y.o., male Today's Date: 03/07/2023  PCP: Mort Sawyers, FNP REFERRING PROVIDER: Mort Sawyers, FNP   End of Session - 03/07/23 1355     Visit Number 8    Number of Visits 25    Date for SLP Re-Evaluation 05/04/23    Authorization Type [employer] Medicare HMO/PPO    Progress Note Due on Visit 10    SLP Start Time 1315    SLP Stop Time  1400    SLP Time Calculation (min) 45 min    Activity Tolerance Patient tolerated treatment well             Past Medical History:  Diagnosis Date   Allergy    Arthritis    Asthma    Chicken pox    Chronic kidney disease    Colon polyps    Diabetes mellitus without complication (HCC)    GERD (gastroesophageal reflux disease)    Heart murmur    Hyperlipidemia    Hypertension    Prostate cancer (HCC)    Syphilis    Past Surgical History:  Procedure Laterality Date   INGUINAL LYMPH NODE BIOPSY     PROSTATE BIOPSY     Patient Active Problem List   Diagnosis Date Noted   Primary hypertension 10/18/2022   Cerebrovascular accident (CVA) due to occlusion of left middle cerebral artery (HCC) 07/19/2022   Iron deficiency anemia 07/19/2022   Mucocele of nasal sinus 07/19/2022   Ischemic stroke (HCC) 05/22/2022   Type 2 diabetes mellitus (HCC) 05/22/2022   Hematuria 05/07/2022   Prediabetes 01/13/2022   Recurrent prostate adenocarcinoma (HCC) 06/13/2020   History of colonic polyps 06/12/2020   Gastro-esophageal reflux disease without esophagitis 06/12/2020   Atherosclerosis of aorta (HCC) 01/10/2020   Mild cognitive disorder 07/26/2019   Hyperlipidemia 07/09/2019   Arthritis 07/09/2019   Malignant neoplasm of prostate (HCC) 02/09/2018    ONSET DATE: 05/22/2022; Date of referral  11/24/2022  REFERRING DIAG: Z86.73 (ICD-10-CM) - Personal history of transient ischemic attack (TIA), and cerebral infarction  without residual deficits   THERAPY DIAG:  Aphasia  Cerebrovascular accident (CVA) due to occlusion of left middle cerebral artery (HCC)  Rationale for Evaluation and Treatment Rehabilitation  SUBJECTIVE:   PERTINENT HISTORY: Pt is an 82 year old right handed male with past medical history of L MCA CVA on 05/22/2022 when he was admited to [facility] d/t global aphasia and confusion. 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 In addition, pt with history of T2DM, colonic polyps, and recurrent prostate adenocarcinoma.    DIAGNOSTIC FINDINGS:  05/23/2022 MRI BRAIN W WO CONTRAST IMPRESSION: Expected evolution of multifocal infarcts.  No evidence of underlying mass lesion or abnormal enhancement.   05/22/2022 MRI BRAIN WO CONTRAST IMPRESSION: Acute infarction of the left frontal, insula, and anterior temporal pole. Additional punctate infarctions in the right frontal and parietal lobe.  Right ethmoid air cell mucocele, when compared to recent CT there is probable dehiscence of the lamina papyracea. Further evaluation with CT sinus can be considered for further characterization as clinically indicated.   05/22/2022 CTA HEAD W CONTRAST IMPRESSION: No acute intracranial hemorrhage or mass. There is an abrupt cut off of 1 of the anterior left M3 segment near the left frontal corona radiata (series 605 image 78) with associated punctate radiodensity on series 3 image 18 which may represent embolic calcified material. There is an adjacent  hypodensity within the left frontal corona radiata which may be acute. The osseous structures are unremarkable.  No large vessel occlusion or stenosis. No aneurysm visualized.    PAIN:  Are you having pain? No  FALLS: Has patient fallen in last 6 months?  No  LIVING ENVIRONMENT: Lives with: living with a friend while he is getting better Lives in: House/apartment  PLOF:  Level of assistance: Independent with ADLs, Independent with  IADLs Employment: Retired   PATIENT GOALS    to improve expressive communication   SUBJECTIVE STATEMENT: "I got my eardrums checked yesterday" Pt unable to provide additional details Pt accompanied by: friend who stayed in lobby  OBJECTIVE:  TODAY'S TREATMENT:  Skilled treatment session focused on pt's aphasia goals. SLP facilitated session by providing the following interventions:  Word finding deficits targeted thru vocabulary building using [facility] 1: Aphasia Rehab - Unit 3 Vocabulary [id number] targeting word finding thru definitions. With Min A, pt able to complete with 75% accuracy d/t semantic paraphasic errors. Suspect some of pt's errors are related to vision (pt has reading glasses but doesn't want to wear them) as well as hearing loss. SLP received copy of pt's audiogram with pt stating that he has decided to get hearing aids    PATIENT EDUCATION: Education details: word finding deficits Person educated: Patient Education method: Explanation Education comprehension: verbalized understanding and needs further education  HOME EXERCISE PROGRAM:   N/A    GOALS:  Goals reviewed with patient? Yes  SHORT TERM GOALS: Target date: 10 sessions  With Min A, patient will complete a semantic feature analysis with at least 3 relevant features for 5/5 target words with minimal cues to improve word-finding skills.  Baseline: Goal status: INITIAL  2.  With Min A, pt will list 10 items in semi-complex category.  Baseline:  Goal status: INITIAL  3.  With Min A, patient will name abstract words and phrases from description at 90% accuracy.   Baseline:  Goal status: INITIAL   LONG TERM GOALS: Target date: 05/04/2023  With Rare Min A, patient will generate sentences with 3 or more words in response to a situation at 80% accuracy in order to increase ability to communicate basic wants and needs.  Baseline:  Goal status: INITIAL  2.  With Supervision A, patient will use a  circumlocution strategy, writing, drawing, and/or gesturing to describe target words with 90% accuracy to improve word-finding and reduce communication breakdowns, Baseline:  Goal status: INITIAL  3.  With Supervision A, patient/family will demonstrate understanding of the following concepts: aphasia, spontaneous recovery, communication vs conversation, strengths/strategies to promote success, local resources by answering multiple choice questions with 80% accuracy when provided supported conversation in order to increase patient's participation in medical care.  Baseline:  Goal status: INITIAL   ASSESSMENT:  CLINICAL IMPRESSION: Patient is a 82 y.o. 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 While pt was eager to participate, he struggled with listing items in categories d/t significant word finding deficits c/b semantic paraphasias and perseverative responses. SLP received copy of pt's audiogram. Pt plans to get hearing aids. See the above treatment note for details.   OBJECTIVE IMPAIRMENTS include expressive language. These impairments are limiting patient from managing medications, managing appointments, managing finances, household responsibilities, ADLs/IADLs, and effectively communicating at home and in community. Factors affecting potential to achieve goals and functional outcome are  N/A . Patient will benefit from skilled SLP services  to address above impairments and improve overall function.  REHAB POTENTIAL: Good  PLAN: SLP FREQUENCY: 1-2x/week  SLP DURATION: 12 weeks  PLANNED INTERVENTIONS: Language facilitation, Functional tasks, Multimodal communication approach, SLP instruction and feedback, Compensatory strategies, and Patient/family education    Shaneisha Burkel B. Dreama Saa, M.S., CCC-SLP, Tree surgeon Certified Brain Injury Specialist [facility]  [facility] Rehabilitation  Services Office [phone] Ascom [phone] Fax [phone]

## 2023-03-09 ENCOUNTER — Ambulatory Visit: Payer: Medicare HMO | Admitting: Speech Pathology

## 2023-03-09 DIAGNOSIS — I63512 Cerebral infarction due to unspecified occlusion or stenosis of left middle cerebral artery: Secondary | ICD-10-CM | POA: Diagnosis not present

## 2023-03-09 DIAGNOSIS — R4701 Aphasia: Secondary | ICD-10-CM

## 2023-03-09 NOTE — Therapy (Signed)
OUTPATIENT SPEECH LANGUAGE PATHOLOGY  APHASIA TREATMENT NOTE   Patient Name: Lawrence Wong MRN: [id number] DOB:1941/02/18, 82 y.o., male Today's Date: 03/09/2023  PCP: Mort Sawyers, FNP REFERRING PROVIDER: Mort Sawyers, FNP   End of Session - 03/09/23 1635     Visit Number 9    Number of Visits 25    Date for SLP Re-Evaluation 05/04/23    Authorization Type [employer] Medicare HMO/PPO    Progress Note Due on Visit 10    SLP Start Time 1315    SLP Stop Time  1400    SLP Time Calculation (min) 45 min    Activity Tolerance Patient tolerated treatment well             Past Medical History:  Diagnosis Date   Allergy    Arthritis    Asthma    Chicken pox    Chronic kidney disease    Colon polyps    Diabetes mellitus without complication (HCC)    GERD (gastroesophageal reflux disease)    Heart murmur    Hyperlipidemia    Hypertension    Prostate cancer (HCC)    Syphilis    Past Surgical History:  Procedure Laterality Date   INGUINAL LYMPH NODE BIOPSY     PROSTATE BIOPSY     Patient Active Problem List   Diagnosis Date Noted   Primary hypertension 10/18/2022   Cerebrovascular accident (CVA) due to occlusion of left middle cerebral artery (HCC) 07/19/2022   Iron deficiency anemia 07/19/2022   Mucocele of nasal sinus 07/19/2022   Ischemic stroke (HCC) 05/22/2022   Type 2 diabetes mellitus (HCC) 05/22/2022   Hematuria 05/07/2022   Prediabetes 01/13/2022   Recurrent prostate adenocarcinoma (HCC) 06/13/2020   History of colonic polyps 06/12/2020   Gastro-esophageal reflux disease without esophagitis 06/12/2020   Atherosclerosis of aorta (HCC) 01/10/2020   Mild cognitive disorder 07/26/2019   Hyperlipidemia 07/09/2019   Arthritis 07/09/2019   Malignant neoplasm of prostate (HCC) 02/09/2018    ONSET DATE: 05/22/2022; Date of referral  11/24/2022  REFERRING DIAG: Z86.73 (ICD-10-CM) - Personal history of transient ischemic attack (TIA), and cerebral infarction  without residual deficits   THERAPY DIAG:  Aphasia  Cerebrovascular accident (CVA) due to occlusion of left middle cerebral artery (HCC)  Rationale for Evaluation and Treatment Rehabilitation  SUBJECTIVE:   PERTINENT HISTORY: Pt is an 82 year old right handed male with past medical history of L MCA CVA on 05/22/2022 when he was admited to [facility] d/t global aphasia and confusion. 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 In addition, pt with history of T2DM, colonic polyps, and recurrent prostate adenocarcinoma.    DIAGNOSTIC FINDINGS:  05/23/2022 MRI BRAIN W WO CONTRAST IMPRESSION: Expected evolution of multifocal infarcts.  No evidence of underlying mass lesion or abnormal enhancement.   05/22/2022 MRI BRAIN WO CONTRAST IMPRESSION: Acute infarction of the left frontal, insula, and anterior temporal pole. Additional punctate infarctions in the right frontal and parietal lobe.  Right ethmoid air cell mucocele, when compared to recent CT there is probable dehiscence of the lamina papyracea. Further evaluation with CT sinus can be considered for further characterization as clinically indicated.   05/22/2022 CTA HEAD W CONTRAST IMPRESSION: No acute intracranial hemorrhage or mass. There is an abrupt cut off of 1 of the anterior left M3 segment near the left frontal corona radiata (series 605 image 78) with associated punctate radiodensity on series 3 image 18 which may represent embolic calcified material. There is an adjacent  hypodensity within the left frontal corona radiata which may be acute. The osseous structures are unremarkable.  No large vessel occlusion or stenosis. No aneurysm visualized.    PAIN:  Are you having pain? No  FALLS: Has patient fallen in last 6 months?  No  LIVING ENVIRONMENT: Lives with: living with a friend while he is getting better Lives in: House/apartment  PLOF:  Level of assistance: Independent with ADLs, Independent with  IADLs Employment: Retired   PATIENT GOALS    to improve expressive communication   SUBJECTIVE STATEMENT: Pt pleasant, eager Pt accompanied by: friend who stayed in lobby  OBJECTIVE:  TODAY'S TREATMENT:  Skilled treatment session focused on pt's aphasia goals. SLP facilitated session by providing the following interventions:  TalkPath Therapy App utilized to target categorization Name the category - Level 1: 100%: Level 2- 12 out of 20 correct, improving to 18 out of 20 with moderate assistance d/t increased complexity of categories Select Items in the Category: Level 1: 10 out of 20 improving to 18 out of 20 with maximal A d/t increased complexity of items listed Sentence Completion: Level 2 - 70%, suspect related to difficulty with reading some of the words contained within the sentence (pt not interested in wearing his reading glasses and with hearing impairment, it is difficult for him to hear SLP correction)   PATIENT EDUCATION: Education details: word finding deficits Person educated: Patient Education method: Explanation Education comprehension: verbalized understanding and needs further education  HOME EXERCISE PROGRAM:   N/A    GOALS:  Goals reviewed with patient? Yes  SHORT TERM GOALS: Target date: 10 sessions  With Min A, patient will complete a semantic feature analysis with at least 3 relevant features for 5/5 target words with minimal cues to improve word-finding skills.  Baseline: Goal status: INITIAL  2.  With Min A, pt will list 10 items in semi-complex category.  Baseline:  Goal status: INITIAL  3.  With Min A, patient will name abstract words and phrases from description at 90% accuracy.   Baseline:  Goal status: INITIAL   LONG TERM GOALS: Target date: 05/04/2023  With Rare Min A, patient will generate sentences with 3 or more words in response to a situation at 80% accuracy in order to increase ability to communicate basic wants and needs.   Baseline:  Goal status: INITIAL  2.  With Supervision A, patient will use a circumlocution strategy, writing, drawing, and/or gesturing to describe target words with 90% accuracy to improve word-finding and reduce communication breakdowns, Baseline:  Goal status: INITIAL  3.  With Supervision A, patient/family will demonstrate understanding of the following concepts: aphasia, spontaneous recovery, communication vs conversation, strengths/strategies to promote success, local resources by answering multiple choice questions with 80% accuracy when provided supported conversation in order to increase patient's participation in medical care.  Baseline:  Goal status: INITIAL   ASSESSMENT:  CLINICAL IMPRESSION: Patient is a 82 y.o. 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 While pt was eager to participate, he struggled with listing items in categories d/t significant word finding deficits c/b semantic paraphasias and perseverative responses. Pt is more successful with basic level language and struggles with semi-complex language as well as reading and hearing SLP's cues. See the above treatment note for details.   OBJECTIVE IMPAIRMENTS include expressive language. These impairments are limiting patient from managing medications, managing appointments, managing finances, household responsibilities, ADLs/IADLs, and effectively communicating at  home and in community. Factors affecting potential to achieve goals and functional outcome are  N/A . Patient will benefit from skilled SLP services to address above impairments and improve overall function.  REHAB POTENTIAL: Good  PLAN: SLP FREQUENCY: 1-2x/week  SLP DURATION: 12 weeks  PLANNED INTERVENTIONS: Language facilitation, Functional tasks, Multimodal communication approach, SLP instruction and feedback, Compensatory strategies, and Patient/family education    Jaxson Keener B. Dreama Saa,  M.S., CCC-SLP, Tree surgeon Certified Brain Injury Specialist [facility]  [facility] Rehabilitation Services Office [phone] Ascom [phone] Fax [phone]

## 2023-03-14 ENCOUNTER — Ambulatory Visit: Payer: Medicare HMO | Admitting: Speech Pathology

## 2023-03-14 DIAGNOSIS — I63512 Cerebral infarction due to unspecified occlusion or stenosis of left middle cerebral artery: Secondary | ICD-10-CM | POA: Diagnosis not present

## 2023-03-14 DIAGNOSIS — R4701 Aphasia: Secondary | ICD-10-CM | POA: Diagnosis not present

## 2023-03-14 NOTE — Therapy (Signed)
OUTPATIENT SPEECH LANGUAGE PATHOLOGY  APHASIA TREATMENT NOTE 10th VISIT PROGRESS NOTE   Patient Name: Lawrence Wong MRN: [id number] DOB:May 29, 1941, 82 y.o., male Today's Date: 03/14/2023   Speech Therapy Progress Note  Dates of Reporting Period: 02/09/2023 to 03/14/2023  Objective: Patient has been seen for 10 speech therapy sessions this reporting period targeting expressive aphasia. Patient is making progress toward LTGs and met 3 STGs this reporting period. See skilled intervention, clinical impressions, and goals below for details.  PCP: Mort Sawyers, FNP REFERRING PROVIDER: Mort Sawyers, FNP   End of Session - 03/14/23 1400     Visit Number 10    Number of Visits 25    Date for SLP Re-Evaluation 05/04/23    Authorization Type [employer] Medicare HMO/PPO    Progress Note Due on Visit 10    SLP Start Time 1315    SLP Stop Time  1400    SLP Time Calculation (min) 45 min    Activity Tolerance Patient tolerated treatment well              Past Medical History:  Diagnosis Date   Allergy    Arthritis    Asthma    Chicken pox    Chronic kidney disease    Colon polyps    Diabetes mellitus without complication (HCC)    GERD (gastroesophageal reflux disease)    Heart murmur    Hyperlipidemia    Hypertension    Prostate cancer (HCC)    Syphilis    Past Surgical History:  Procedure Laterality Date   INGUINAL LYMPH NODE BIOPSY     PROSTATE BIOPSY     Patient Active Problem List   Diagnosis Date Noted   Primary hypertension 10/18/2022   Cerebrovascular accident (CVA) due to occlusion of left middle cerebral artery (HCC) 07/19/2022   Iron deficiency anemia 07/19/2022   Mucocele of nasal sinus 07/19/2022   Ischemic stroke (HCC) 05/22/2022   Type 2 diabetes mellitus (HCC) 05/22/2022   Hematuria 05/07/2022   Prediabetes 01/13/2022   Recurrent prostate adenocarcinoma (HCC) 06/13/2020   History of colonic polyps 06/12/2020   Gastro-esophageal reflux disease  without esophagitis 06/12/2020   Atherosclerosis of aorta (HCC) 01/10/2020   Mild cognitive disorder 07/26/2019   Hyperlipidemia 07/09/2019   Arthritis 07/09/2019   Malignant neoplasm of prostate (HCC) 02/09/2018    ONSET DATE: 05/22/2022; Date of referral  11/24/2022  REFERRING DIAG: Z86.73 (ICD-10-CM) - Personal history of transient ischemic attack (TIA), and cerebral infarction without residual deficits   THERAPY DIAG:  Aphasia  Cerebrovascular accident (CVA) due to occlusion of left middle cerebral artery (HCC)  Rationale for Evaluation and Treatment Rehabilitation  SUBJECTIVE:   PERTINENT HISTORY: Pt is an 82 year old right handed male with past medical history of L MCA CVA on 05/22/2022 when he was admited to [facility] d/t global aphasia and confusion. 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 In addition, pt with history of T2DM, colonic polyps, and recurrent prostate adenocarcinoma.    DIAGNOSTIC FINDINGS:  05/23/2022 MRI BRAIN W WO CONTRAST IMPRESSION: Expected evolution of multifocal infarcts.  No evidence of underlying mass lesion or abnormal enhancement.   05/22/2022 MRI BRAIN WO CONTRAST IMPRESSION: Acute infarction of the left frontal, insula, and anterior temporal pole. Additional punctate infarctions in the right frontal and parietal lobe.  Right ethmoid air cell mucocele, when compared to recent CT there is probable dehiscence of the lamina papyracea. Further evaluation with CT sinus can be considered for further characterization as  clinically indicated.   05/22/2022 CTA HEAD W CONTRAST IMPRESSION: No acute intracranial hemorrhage or mass. There is an abrupt cut off of 1 of the anterior left M3 segment near the left frontal corona radiata (series 605 image 78) with associated punctate radiodensity on series 3 image 18 which may represent embolic calcified material. There is an adjacent hypodensity within the left frontal corona radiata which may be  acute. The osseous structures are unremarkable.  No large vessel occlusion or stenosis. No aneurysm visualized.    PAIN:  Are you having pain? No  FALLS: Has patient fallen in last 6 months?  No  LIVING ENVIRONMENT: Lives with: living with a friend while he is getting better Lives in: House/apartment  PLOF:  Level of assistance: Independent with ADLs, Independent with IADLs Employment: Retired   PATIENT GOALS    to improve expressive communication   SUBJECTIVE STATEMENT: "Getting better, it's coming" referring to progress Pt accompanied by: friend who stayed in lobby  OBJECTIVE:  TODAY'S TREATMENT:  Skilled treatment session focused on pt's aphasia goals. SLP facilitated session by providing the following interventions:  TalkPath Therapy App utilized to target categorization Flash card Naming Nouns  - Level 2 - 100% Flash card Naming Nouns - Level 3 - 18 out of 20  Flash card Naming Verbs - Level 2 - increased difficulty d/t abstract nature of pictures/verbs Reading - Picture Description - Level 3 - 17 out of 20 with rare Min A d/t semantic errors General knowledge - Level 1 - 17 out of 20 with rare Min A d/t semantic errors Reading - Sentence Completion - Level 2 - 7 out of 10 with rare Min A    PATIENT EDUCATION: Education details: word finding deficits Person educated: Patient Education method: Explanation Education comprehension: verbalized understanding and needs further education  HOME EXERCISE PROGRAM:   N/A    GOALS:  Goals reviewed with patient? Yes  SHORT TERM GOALS: Target date: 10 sessions  With Min A, patient will complete a semantic feature analysis with at least 3 relevant features for 5/5 target words with minimal cues to improve word-finding skills.  Baseline: Goal status: INITIAL: progress made - 75%  2.  With Min A, pt will list 10 items in semi-complex category.  Baseline:  Goal status: INITIAL: MET  3.  With Min A, patient will name  abstract words and phrases from description at 90% accuracy.   Baseline:  Goal status: INITIAL: MET   LONG TERM GOALS: Target date: 05/04/2023  With Rare Min A, patient will generate sentences with 3 or more words in response to a situation at 80% accuracy in order to increase ability to communicate basic wants and needs.  Baseline:  Goal status: INITIAL: progress made  2.  With Supervision A, patient will use a circumlocution strategy, writing, drawing, and/or gesturing to describe target words with 90% accuracy to improve word-finding and reduce communication breakdowns, Baseline:  Goal status: INITIAL: progress made  3.  With Supervision A, patient/family will demonstrate understanding of the following concepts: aphasia, spontaneous recovery, communication vs conversation, strengths/strategies to promote success, local resources by answering multiple choice questions with 80% accuracy when provided supported conversation in order to increase patient's participation in medical care.  Baseline:  Goal status: INITIAL: progress made   ASSESSMENT:  CLINICAL IMPRESSION: Patient is a 82 y.o. 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 Pt continues to make progress towards goals  and reports that his hearing aids will be "in on the 17th."  See the above treatment note for details.   OBJECTIVE IMPAIRMENTS include expressive language. These impairments are limiting patient from managing medications, managing appointments, managing finances, household responsibilities, ADLs/IADLs, and effectively communicating at home and in community. Factors affecting potential to achieve goals and functional outcome are  N/A . Patient will benefit from skilled SLP services to address above impairments and improve overall function.  REHAB POTENTIAL: Good  PLAN: SLP FREQUENCY: 1-2x/week  SLP DURATION: 12 weeks  PLANNED INTERVENTIONS: Language  facilitation, Functional tasks, Multimodal communication approach, SLP instruction and feedback, Compensatory strategies, and Patient/family education    Jaziah Goeller B. Dreama Saa, M.S., CCC-SLP, Tree surgeon Certified Brain Injury Specialist [facility]  [facility] Rehabilitation Services Office [phone] Ascom [phone] Fax [phone]

## 2023-03-16 ENCOUNTER — Ambulatory Visit: Payer: Medicare HMO | Admitting: Speech Pathology

## 2023-03-16 DIAGNOSIS — R4701 Aphasia: Secondary | ICD-10-CM

## 2023-03-16 DIAGNOSIS — I63512 Cerebral infarction due to unspecified occlusion or stenosis of left middle cerebral artery: Secondary | ICD-10-CM | POA: Diagnosis not present

## 2023-03-16 NOTE — Therapy (Signed)
OUTPATIENT SPEECH LANGUAGE PATHOLOGY  APHASIA TREATMENT NOTE    Patient Name: Lawrence Wong MRN: [id number] DOB:July 23, 1940, 82 y.o., male Today's Date: 03/16/2023   PCP: Mort Sawyers, FNP REFERRING PROVIDER: Mort Sawyers, FNP   End of Session - 03/16/23 1323     Visit Number 11    Number of Visits 25    Date for SLP Re-Evaluation 05/04/23    Authorization Type [employer] HMO/PPO    Progress Note Due on Visit 20    SLP Start Time 1315    SLP Stop Time  1400    SLP Time Calculation (min) 45 min    Activity Tolerance Patient tolerated treatment well              Past Medical History:  Diagnosis Date   Allergy    Arthritis    Asthma    Chicken pox    Chronic kidney disease    Colon polyps    Diabetes mellitus without complication (HCC)    GERD (gastroesophageal reflux disease)    Heart murmur    Hyperlipidemia    Hypertension    Prostate cancer (HCC)    Syphilis    Past Surgical History:  Procedure Laterality Date   INGUINAL LYMPH NODE BIOPSY     PROSTATE BIOPSY     Patient Active Problem List   Diagnosis Date Noted   Primary hypertension 10/18/2022   Cerebrovascular accident (CVA) due to occlusion of left middle cerebral artery (HCC) 07/19/2022   Iron deficiency anemia 07/19/2022   Mucocele of nasal sinus 07/19/2022   Ischemic stroke (HCC) 05/22/2022   Type 2 diabetes mellitus (HCC) 05/22/2022   Hematuria 05/07/2022   Prediabetes 01/13/2022   Recurrent prostate adenocarcinoma (HCC) 06/13/2020   History of colonic polyps 06/12/2020   Gastro-esophageal reflux disease without esophagitis 06/12/2020   Atherosclerosis of aorta (HCC) 01/10/2020   Mild cognitive disorder 07/26/2019   Hyperlipidemia 07/09/2019   Arthritis 07/09/2019   Malignant neoplasm of prostate (HCC) 02/09/2018    ONSET DATE: 05/22/2022; Date of referral  11/24/2022  REFERRING DIAG: Z86.73 (ICD-10-CM) - Personal history of transient ischemic attack (TIA), and cerebral  infarction without residual deficits   THERAPY DIAG:  Aphasia  Cerebrovascular accident (CVA) due to occlusion of left middle cerebral artery (HCC)  Rationale for Evaluation and Treatment Rehabilitation  SUBJECTIVE:   PERTINENT HISTORY: Pt is an 82 year old right handed male with past medical history of L MCA CVA on 05/22/2022 when he was admited to [facility] d/t global aphasia and confusion. 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 In addition, pt with history of T2DM, colonic polyps, and recurrent prostate adenocarcinoma.    DIAGNOSTIC FINDINGS:  05/23/2022 MRI BRAIN W WO CONTRAST IMPRESSION: Expected evolution of multifocal infarcts.  No evidence of underlying mass lesion or abnormal enhancement.   05/22/2022 MRI BRAIN WO CONTRAST IMPRESSION: Acute infarction of the left frontal, insula, and anterior temporal pole. Additional punctate infarctions in the right frontal and parietal lobe.  Right ethmoid air cell mucocele, when compared to recent CT there is probable dehiscence of the lamina papyracea. Further evaluation with CT sinus can be considered for further characterization as clinically indicated.   05/22/2022 CTA HEAD W CONTRAST IMPRESSION: No acute intracranial hemorrhage or mass. There is an abrupt cut off of 1 of the anterior left M3 segment near the left frontal corona radiata (series 605 image 78) with associated punctate radiodensity on series 3 image 18 which may represent embolic calcified material. There  is an adjacent hypodensity within the left frontal corona radiata which may be acute. The osseous structures are unremarkable.  No large vessel occlusion or stenosis. No aneurysm visualized.    PAIN:  Are you having pain? No  FALLS: Has patient fallen in last 6 months?  No  LIVING ENVIRONMENT: Lives with: living with a friend while he is getting better Lives in: House/apartment  PLOF:  Level of assistance: Independent with ADLs, Independent with  IADLs Employment: Retired   PATIENT GOALS    to improve expressive communication   SUBJECTIVE STATEMENT: "It sure has gotten colder" Pt accompanied by: friend who stayed in lobby  OBJECTIVE:  TODAY'S TREATMENT:  Skilled treatment session focused on pt's aphasia goals. SLP facilitated session by providing the following interventions:  TalkPath Therapy App utilized to target categorization Reading - Sentence Completion - Level 4 - 17 out of 20 with rare Min A   Constant Therapy Clinician Put concepts in order - Level 1 - 96% with rare Min A Put pictures into categories - Level 1 - 80% with rare Min A Reading everyday things - Level 3 - 90% with Rare Min A    PATIENT EDUCATION: Education details: word finding deficits Person educated: Patient Education method: Explanation Education comprehension: verbalized understanding and needs further education  HOME EXERCISE PROGRAM:   N/A    GOALS:  Goals reviewed with patient? Yes  SHORT TERM GOALS: Target date: 10 sessions  With Min A, patient will complete a semantic feature analysis with at least 3 relevant features for 5/5 target words with minimal cues to improve word-finding skills.  Baseline: Goal status: INITIAL: progress made - 75%  2.  With Min A, pt will list 10 items in semi-complex category.  Baseline:  Goal status: INITIAL: MET  3.  With Min A, patient will name abstract words and phrases from description at 90% accuracy.   Baseline:  Goal status: INITIAL: MET   LONG TERM GOALS: Target date: 05/04/2023  With Rare Min A, patient will generate sentences with 3 or more words in response to a situation at 80% accuracy in order to increase ability to communicate basic wants and needs.  Baseline:  Goal status: INITIAL: progress made  2.  With Supervision A, patient will use a circumlocution strategy, writing, drawing, and/or gesturing to describe target words with 90% accuracy to improve word-finding and reduce  communication breakdowns, Baseline:  Goal status: INITIAL: progress made  3.  With Supervision A, patient/family will demonstrate understanding of the following concepts: aphasia, spontaneous recovery, communication vs conversation, strengths/strategies to promote success, local resources by answering multiple choice questions with 80% accuracy when provided supported conversation in order to increase patient's participation in medical care.  Baseline:  Goal status: INITIAL: progress made   ASSESSMENT:  CLINICAL IMPRESSION: Patient is a 82 y.o. 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 Pt continues to make progress towards goals and reports that his hearing aids will be "in on the 17th."  See the above treatment note for details.   OBJECTIVE IMPAIRMENTS include expressive language. These impairments are limiting patient from managing medications, managing appointments, managing finances, household responsibilities, ADLs/IADLs, and effectively communicating at home and in community. Factors affecting potential to achieve goals and functional outcome are  N/A . Patient will benefit from skilled SLP services to address above impairments and improve overall function.  REHAB POTENTIAL: Good  PLAN: SLP FREQUENCY: 1-2x/week  SLP  DURATION: 12 weeks  PLANNED INTERVENTIONS: Language facilitation, Functional tasks, Multimodal communication approach, SLP instruction and feedback, Compensatory strategies, and Patient/family education    Shyloh Derosa B. Dreama Saa, M.S., CCC-SLP, Tree surgeon Certified Brain Injury Specialist [facility]  [facility] Rehabilitation Services Office [phone] Ascom [phone] Fax [phone]

## 2023-03-21 ENCOUNTER — Ambulatory Visit: Payer: Medicare HMO | Admitting: Speech Pathology

## 2023-03-21 DIAGNOSIS — R4701 Aphasia: Secondary | ICD-10-CM

## 2023-03-21 DIAGNOSIS — I63512 Cerebral infarction due to unspecified occlusion or stenosis of left middle cerebral artery: Secondary | ICD-10-CM

## 2023-03-21 NOTE — Therapy (Signed)
OUTPATIENT SPEECH LANGUAGE PATHOLOGY  APHASIA TREATMENT NOTE    Patient Name: Lawrence Wong MRN: [id number] DOB:[birth year], 82 y.o., male Today's Date: 03/21/2023   PCP: Mort Sawyers, FNP REFERRING PROVIDER: Mort Sawyers, FNP   End of Session - 03/21/23 1357     Visit Number 12    Number of Visits 25    Date for SLP Re-Evaluation 05/04/23    Authorization Type [employer] Medicare HMO/PPO    Progress Note Due on Visit 20    SLP Start Time 1315    SLP Stop Time  1400    SLP Time Calculation (min) 45 min    Activity Tolerance Patient tolerated treatment well              Past Medical History:  Diagnosis Date   Allergy    Arthritis    Asthma    Chicken pox    Chronic kidney disease    Colon polyps    Diabetes mellitus without complication (HCC)    GERD (gastroesophageal reflux disease)    Heart murmur    Hyperlipidemia    Hypertension    Prostate cancer (HCC)    Syphilis    Past Surgical History:  Procedure Laterality Date   INGUINAL LYMPH NODE BIOPSY     PROSTATE BIOPSY     Patient Active Problem List   Diagnosis Date Noted   Primary hypertension 10/18/2022   Cerebrovascular accident (CVA) due to occlusion of left middle cerebral artery (HCC) 07/19/2022   Iron deficiency anemia 07/19/2022   Mucocele of nasal sinus 07/19/2022   Ischemic stroke (HCC) 05/22/2022   Type 2 diabetes mellitus (HCC) 05/22/2022   Hematuria 05/07/2022   Prediabetes 01/13/2022   Recurrent prostate adenocarcinoma (HCC) 06/13/2020   History of colonic polyps 06/12/2020   Gastro-esophageal reflux disease without esophagitis 06/12/2020   Atherosclerosis of aorta (HCC) 01/10/2020   Mild cognitive disorder 07/26/2019   Hyperlipidemia 07/09/2019   Arthritis 07/09/2019   Malignant neoplasm of prostate (HCC) 02/09/2018    ONSET DATE: 05/22/2022; Date of referral  11/24/2022  REFERRING DIAG: Z86.73 (ICD-10-CM) - Personal history of transient ischemic attack (TIA), and cerebral  infarction without residual deficits   THERAPY DIAG:  Aphasia  Cerebrovascular accident (CVA) due to occlusion of left middle cerebral artery (HCC)  Rationale for Evaluation and Treatment Rehabilitation  SUBJECTIVE:   PERTINENT HISTORY: Pt is an 82 year old right handed male with past medical history of L MCA CVA on 05/22/2022 when he was admited to [facility] d/t global aphasia and confusion. 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 In addition, pt with history of T2DM, colonic polyps, and recurrent prostate adenocarcinoma.    DIAGNOSTIC FINDINGS:  05/23/2022 MRI BRAIN W WO CONTRAST IMPRESSION: Expected evolution of multifocal infarcts.  No evidence of underlying mass lesion or abnormal enhancement.   05/22/2022 MRI BRAIN WO CONTRAST IMPRESSION: Acute infarction of the left frontal, insula, and anterior temporal pole. Additional punctate infarctions in the right frontal and parietal lobe.  Right ethmoid air cell mucocele, when compared to recent CT there is probable dehiscence of the lamina papyracea. Further evaluation with CT sinus can be considered for further characterization as clinically indicated.   05/22/2022 CTA HEAD W CONTRAST IMPRESSION: No acute intracranial hemorrhage or mass. There is an abrupt cut off of 1 of the anterior left M3 segment near the left frontal corona radiata (series 605 image 78) with associated punctate radiodensity on series 3 image 18 which may represent embolic calcified material. There  is an adjacent hypodensity within the left frontal corona radiata which may be acute. The osseous structures are unremarkable.  No large vessel occlusion or stenosis. No aneurysm visualized.    PAIN:  Are you having pain? No  FALLS: Has patient fallen in last 6 months?  No  LIVING ENVIRONMENT: Lives with: living with a friend while he is getting better Lives in: House/apartment  PLOF:  Level of assistance: Independent with ADLs, Independent with  IADLs Employment: Retired   PATIENT GOALS    to improve expressive communication   SUBJECTIVE STATEMENT: Pt had his hearing aids in - he continued to say "I just don't know" Pt accompanied by: friend who stayed in lobby  OBJECTIVE:  TODAY'S TREATMENT:  Skilled treatment session focused on pt's aphasia goals. SLP facilitated session by providing the following interventions:  Pt had bilateral hearing aids in and states that he was able to hear this writer better than previously but was not able to provide additional information to all SLP questions he commented "I just don't know, I have never worked with them before" Pt has a follow up appt with audiology in ~ 2 weeks  Constant Therapy Clinician Reading everyday things - Level 1 - 90% with Rare Min A: Level 2 - 60% with moderate A, pt struggled with this activity today - he stated "I am struggling with my concentration today" no other deficits noted   PATIENT EDUCATION: Education details: word finding deficits Person educated: Patient Education method: Explanation Education comprehension: verbalized understanding and needs further education  HOME EXERCISE PROGRAM:   N/A    GOALS:  Goals reviewed with patient? Yes  SHORT TERM GOALS: Target date: 10 sessions  With Min A, patient will complete a semantic feature analysis with at least 3 relevant features for 5/5 target words with minimal cues to improve word-finding skills.  Baseline: Goal status: INITIAL: progress made - 75%  2.  With Min A, pt will list 10 items in semi-complex category.  Baseline:  Goal status: INITIAL: MET  3.  With Min A, patient will name abstract words and phrases from description at 90% accuracy.   Baseline:  Goal status: INITIAL: MET   LONG TERM GOALS: Target date: 05/04/2023  With Rare Min A, patient will generate sentences with 3 or more words in response to a situation at 80% accuracy in order to increase ability to communicate basic wants  and needs.  Baseline:  Goal status: INITIAL: progress made  2.  With Supervision A, patient will use a circumlocution strategy, writing, drawing, and/or gesturing to describe target words with 90% accuracy to improve word-finding and reduce communication breakdowns, Baseline:  Goal status: INITIAL: progress made  3.  With Supervision A, patient/family will demonstrate understanding of the following concepts: aphasia, spontaneous recovery, communication vs conversation, strengths/strategies to promote success, local resources by answering multiple choice questions with 80% accuracy when provided supported conversation in order to increase patient's participation in medical care.  Baseline:  Goal status: INITIAL: progress made   ASSESSMENT:  CLINICAL IMPRESSION: Patient is a 82 y.o. 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 Pt now wearing hearing aids but struggled with today's activities d/t self-reported difficulty with concentration.  See the above treatment note for details.   OBJECTIVE IMPAIRMENTS include expressive language. These impairments are limiting patient from managing medications, managing appointments, managing finances, household responsibilities, ADLs/IADLs, and effectively communicating at home and in community. Factors  affecting potential to achieve goals and functional outcome are  N/A . Patient will benefit from skilled SLP services to address above impairments and improve overall function.  REHAB POTENTIAL: Good  PLAN: SLP FREQUENCY: 1-2x/week  SLP DURATION: 12 weeks  PLANNED INTERVENTIONS: Language facilitation, Functional tasks, Multimodal communication approach, SLP instruction and feedback, Compensatory strategies, and Patient/family education    Florentino Laabs B. Dreama Saa, M.S., CCC-SLP, Tree surgeon Certified Brain Injury Specialist [facility]  [facility] Rehabilitation Services Office [phone] Ascom [phone] Fax [phone]

## 2023-03-23 ENCOUNTER — Ambulatory Visit: Payer: Medicare HMO | Admitting: Speech Pathology

## 2023-03-23 DIAGNOSIS — I63512 Cerebral infarction due to unspecified occlusion or stenosis of left middle cerebral artery: Secondary | ICD-10-CM

## 2023-03-23 DIAGNOSIS — R4701 Aphasia: Secondary | ICD-10-CM

## 2023-03-23 NOTE — Therapy (Signed)
OUTPATIENT SPEECH LANGUAGE PATHOLOGY  APHASIA TREATMENT NOTE    Patient Name: Lawrence Wong MRN: [id number] DOB:04-May-1941, 82 y.o., male Today's Date: 03/23/2023   PCP: Mort Sawyers, FNP REFERRING PROVIDER: Mort Sawyers, FNP   End of Session - 03/23/23 1312     Visit Number 13    Number of Visits 25    Date for SLP Re-Evaluation 05/04/23    Authorization Type [employer] Medicare HMO/PPO    Progress Note Due on Visit 20    SLP Start Time 1315    SLP Stop Time  1400    SLP Time Calculation (min) 45 min    Activity Tolerance Patient tolerated treatment well              Past Medical History:  Diagnosis Date   Allergy    Arthritis    Asthma    Chicken pox    Chronic kidney disease    Colon polyps    Diabetes mellitus without complication (HCC)    GERD (gastroesophageal reflux disease)    Heart murmur    Hyperlipidemia    Hypertension    Prostate cancer (HCC)    Syphilis    Past Surgical History:  Procedure Laterality Date   INGUINAL LYMPH NODE BIOPSY     PROSTATE BIOPSY     Patient Active Problem List   Diagnosis Date Noted   Primary hypertension 10/18/2022   Cerebrovascular accident (CVA) due to occlusion of left middle cerebral artery (HCC) 07/19/2022   Iron deficiency anemia 07/19/2022   Mucocele of nasal sinus 07/19/2022   Ischemic stroke (HCC) 05/22/2022   Type 2 diabetes mellitus (HCC) 05/22/2022   Hematuria 05/07/2022   Prediabetes 01/13/2022   Recurrent prostate adenocarcinoma (HCC) 06/13/2020   History of colonic polyps 06/12/2020   Gastro-esophageal reflux disease without esophagitis 06/12/2020   Atherosclerosis of aorta (HCC) 01/10/2020   Mild cognitive disorder 07/26/2019   Hyperlipidemia 07/09/2019   Arthritis 07/09/2019   Malignant neoplasm of prostate (HCC) 02/09/2018    ONSET DATE: 05/22/2022; Date of referral  11/24/2022  REFERRING DIAG: Z86.73 (ICD-10-CM) - Personal history of transient ischemic attack (TIA), and cerebral  infarction without residual deficits   THERAPY DIAG:  Aphasia  Cerebrovascular accident (CVA) due to occlusion of left middle cerebral artery (HCC)  Rationale for Evaluation and Treatment Rehabilitation  SUBJECTIVE:   PERTINENT HISTORY: Pt is an 82 year old right handed male with past medical history of L MCA CVA on 05/22/2022 when he was admited to [facility] d/t global aphasia and confusion. 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 In addition, pt with history of T2DM, colonic polyps, and recurrent prostate adenocarcinoma.    DIAGNOSTIC FINDINGS:  05/23/2022 MRI BRAIN W WO CONTRAST IMPRESSION: Expected evolution of multifocal infarcts.  No evidence of underlying mass lesion or abnormal enhancement.   05/22/2022 MRI BRAIN WO CONTRAST IMPRESSION: Acute infarction of the left frontal, insula, and anterior temporal pole. Additional punctate infarctions in the right frontal and parietal lobe.  Right ethmoid air cell mucocele, when compared to recent CT there is probable dehiscence of the lamina papyracea. Further evaluation with CT sinus can be considered for further characterization as clinically indicated.   05/22/2022 CTA HEAD W CONTRAST IMPRESSION: No acute intracranial hemorrhage or mass. There is an abrupt cut off of 1 of the anterior left M3 segment near the left frontal corona radiata (series 605 image 78) with associated punctate radiodensity on series 3 image 18 which may represent embolic calcified material. There  is an adjacent hypodensity within the left frontal corona radiata which may be acute. The osseous structures are unremarkable.  No large vessel occlusion or stenosis. No aneurysm visualized.    PAIN:  Are you having pain? No  FALLS: Has patient fallen in last 6 months?  No  LIVING ENVIRONMENT: Lives with: living with a friend while he is getting better Lives in: House/apartment  PLOF:  Level of assistance: Independent with ADLs, Independent with  IADLs Employment: Retired   PATIENT GOALS    to improve expressive communication   SUBJECTIVE STATEMENT: "I went back and had them inspected (hearing aids) and they made some adjustments, it tells a difference in the way it sounds" Pt accompanied by: friend who stayed in lobby  OBJECTIVE:  TODAY'S TREATMENT:  Skilled treatment session focused on pt's aphasia goals. SLP facilitated session by providing the following interventions:  TalkPath Therapy App utilized to target vocabulary building: What am I? - 80% independently Categorization - what category? - Level 1 - 80% independently with more than a reasonable amount of time    PATIENT EDUCATION: Education details: word finding deficits Person educated: Patient Education method: Explanation Education comprehension: verbalized understanding and needs further education  HOME EXERCISE PROGRAM:   N/A    GOALS:  Goals reviewed with patient? Yes  SHORT TERM GOALS: Target date: 10 sessions  With Min A, patient will complete a semantic feature analysis with at least 3 relevant features for 5/5 target words with minimal cues to improve word-finding skills.  Baseline: Goal status: INITIAL: progress made - 75%  2.  With Min A, pt will list 10 items in semi-complex category.  Baseline:  Goal status: INITIAL: MET  3.  With Min A, patient will name abstract words and phrases from description at 90% accuracy.   Baseline:  Goal status: INITIAL: MET   LONG TERM GOALS: Target date: 05/04/2023  With Rare Min A, patient will generate sentences with 3 or more words in response to a situation at 80% accuracy in order to increase ability to communicate basic wants and needs.  Baseline:  Goal status: INITIAL: progress made  2.  With Supervision A, patient will use a circumlocution strategy, writing, drawing, and/or gesturing to describe target words with 90% accuracy to improve word-finding and reduce communication  breakdowns, Baseline:  Goal status: INITIAL: progress made  3.  With Supervision A, patient/family will demonstrate understanding of the following concepts: aphasia, spontaneous recovery, communication vs conversation, strengths/strategies to promote success, local resources by answering multiple choice questions with 80% accuracy when provided supported conversation in order to increase patient's participation in medical care.  Baseline:  Goal status: INITIAL: progress made   ASSESSMENT:  CLINICAL IMPRESSION: Patient is a 82 y.o. 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 Pt with improved responses suspect d/t newly calibrated hearing aids and he also decided to wear his reading glasses. See the above treatment note for details.   OBJECTIVE IMPAIRMENTS include expressive language. These impairments are limiting patient from managing medications, managing appointments, managing finances, household responsibilities, ADLs/IADLs, and effectively communicating at home and in community. Factors affecting potential to achieve goals and functional outcome are  N/A . Patient will benefit from skilled SLP services to address above impairments and improve overall function.  REHAB POTENTIAL: Good  PLAN: SLP FREQUENCY: 1-2x/week  SLP DURATION: 12 weeks  PLANNED INTERVENTIONS: Language facilitation, Functional tasks, Multimodal communication approach, SLP instruction and feedback,  Compensatory strategies, and Patient/family education    Bruno Leach B. Dreama Saa, M.S., CCC-SLP, Tree surgeon Certified Brain Injury Specialist [facility]  [facility] Rehabilitation Services Office [phone] Ascom [phone] Fax [phone]

## 2023-03-28 ENCOUNTER — Ambulatory Visit: Payer: Medicare HMO | Admitting: Speech Pathology

## 2023-03-28 DIAGNOSIS — I63512 Cerebral infarction due to unspecified occlusion or stenosis of left middle cerebral artery: Secondary | ICD-10-CM

## 2023-03-28 DIAGNOSIS — R4701 Aphasia: Secondary | ICD-10-CM

## 2023-03-28 NOTE — Therapy (Signed)
OUTPATIENT SPEECH LANGUAGE PATHOLOGY  APHASIA TREATMENT NOTE    Patient Name: Lawrence Wong MRN: [id number] DOB:05-25-1941, 82 y.o., male Today's Date: 03/28/2023   PCP: Mort Sawyers, FNP REFERRING PROVIDER: Mort Sawyers, FNP   End of Session - 03/28/23 1314     Visit Number 14    Number of Visits 25    Date for SLP Re-Evaluation 05/04/23    Authorization Type [employer] Medicare HMO/PPO    Progress Note Due on Visit 20    SLP Start Time 1310    SLP Stop Time  1355    SLP Time Calculation (min) 45 min    Activity Tolerance Patient tolerated treatment well              Past Medical History:  Diagnosis Date   Allergy    Arthritis    Asthma    Chicken pox    Chronic kidney disease    Colon polyps    Diabetes mellitus without complication (HCC)    GERD (gastroesophageal reflux disease)    Heart murmur    Hyperlipidemia    Hypertension    Prostate cancer (HCC)    Syphilis    Past Surgical History:  Procedure Laterality Date   INGUINAL LYMPH NODE BIOPSY     PROSTATE BIOPSY     Patient Active Problem List   Diagnosis Date Noted   Primary hypertension 10/18/2022   Cerebrovascular accident (CVA) due to occlusion of left middle cerebral artery (HCC) 07/19/2022   Iron deficiency anemia 07/19/2022   Mucocele of nasal sinus 07/19/2022   Ischemic stroke (HCC) 05/22/2022   Type 2 diabetes mellitus (HCC) 05/22/2022   Hematuria 05/07/2022   Prediabetes 01/13/2022   Recurrent prostate adenocarcinoma (HCC) 06/13/2020   History of colonic polyps 06/12/2020   Gastro-esophageal reflux disease without esophagitis 06/12/2020   Atherosclerosis of aorta (HCC) 01/10/2020   Mild cognitive disorder 07/26/2019   Hyperlipidemia 07/09/2019   Arthritis 07/09/2019   Malignant neoplasm of prostate (HCC) 02/09/2018    ONSET DATE: 05/22/2022; Date of referral  11/24/2022  REFERRING DIAG: Z86.73 (ICD-10-CM) - Personal history of transient ischemic attack (TIA), and cerebral  infarction without residual deficits   THERAPY DIAG:  Aphasia  Cerebrovascular accident (CVA) due to occlusion of left middle cerebral artery (HCC)  Rationale for Evaluation and Treatment Rehabilitation  SUBJECTIVE:   PERTINENT HISTORY: Pt is an 82 year old right handed male with past medical history of L MCA CVA on 05/22/2022 when he was admited to [facility] d/t global aphasia and confusion. 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 In addition, pt with history of T2DM, colonic polyps, and recurrent prostate adenocarcinoma.    DIAGNOSTIC FINDINGS:  05/23/2022 MRI BRAIN W WO CONTRAST IMPRESSION: Expected evolution of multifocal infarcts.  No evidence of underlying mass lesion or abnormal enhancement.   05/22/2022 MRI BRAIN WO CONTRAST IMPRESSION: Acute infarction of the left frontal, insula, and anterior temporal pole. Additional punctate infarctions in the right frontal and parietal lobe.  Right ethmoid air cell mucocele, when compared to recent CT there is probable dehiscence of the lamina papyracea. Further evaluation with CT sinus can be considered for further characterization as clinically indicated.   05/22/2022 CTA HEAD W CONTRAST IMPRESSION: No acute intracranial hemorrhage or mass. There is an abrupt cut off of 1 of the anterior left M3 segment near the left frontal corona radiata (series 605 image 78) with associated punctate radiodensity on series 3 image 18 which may represent embolic calcified material. There  is an adjacent hypodensity within the left frontal corona radiata which may be acute. The osseous structures are unremarkable.  No large vessel occlusion or stenosis. No aneurysm visualized.    PAIN:  Are you having pain? No  FALLS: Has patient fallen in last 6 months?  No  LIVING ENVIRONMENT: Lives with: living with a friend while he is getting better Lives in: House/apartment  PLOF:  Level of assistance: Independent with ADLs, Independent with  IADLs Employment: Retired   PATIENT GOALS    to improve expressive communication   SUBJECTIVE STATEMENT: "I went to church a couple of times this weekend" Pt accompanied by: friend who stayed in lobby  OBJECTIVE:  TODAY'S TREATMENT:  Skilled treatment session focused on pt's aphasia goals. SLP facilitated session by providing the following interventions:  TalkPath Therapy App utilized to target vocabulary building: Flashcard Naming Nouns - Level 3 - 90% independently What does not belong? - Level 2 - 70% improving to 95% with moderate verbal cues   PATIENT EDUCATION: Education details: word finding deficits Person educated: Patient Education method: Explanation Education comprehension: verbalized understanding and needs further education  HOME EXERCISE PROGRAM:   N/A    GOALS:  Goals reviewed with patient? Yes  SHORT TERM GOALS: Target date: 10 sessions  With Min A, patient will complete a semantic feature analysis with at least 3 relevant features for 5/5 target words with minimal cues to improve word-finding skills.  Baseline: Goal status: INITIAL: progress made - 75%  2.  With Min A, pt will list 10 items in semi-complex category.  Baseline:  Goal status: INITIAL: MET  3.  With Min A, patient will name abstract words and phrases from description at 90% accuracy.   Baseline:  Goal status: INITIAL: MET   LONG TERM GOALS: Target date: 05/04/2023  With Rare Min A, patient will generate sentences with 3 or more words in response to a situation at 80% accuracy in order to increase ability to communicate basic wants and needs.  Baseline:  Goal status: INITIAL: progress made  2.  With Supervision A, patient will use a circumlocution strategy, writing, drawing, and/or gesturing to describe target words with 90% accuracy to improve word-finding and reduce communication breakdowns, Baseline:  Goal status: INITIAL: progress made  3.  With Supervision A,  patient/family will demonstrate understanding of the following concepts: aphasia, spontaneous recovery, communication vs conversation, strengths/strategies to promote success, local resources by answering multiple choice questions with 80% accuracy when provided supported conversation in order to increase patient's participation in medical care.  Baseline:  Goal status: INITIAL: progress made   ASSESSMENT:  CLINICAL IMPRESSION: Patient is a 82 y.o. 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  See the above treatment note for details.   OBJECTIVE IMPAIRMENTS include expressive language. These impairments are limiting patient from managing medications, managing appointments, managing finances, household responsibilities, ADLs/IADLs, and effectively communicating at home and in community. Factors affecting potential to achieve goals and functional outcome are  N/A . Patient will benefit from skilled SLP services to address above impairments and improve overall function.  REHAB POTENTIAL: Good  PLAN: SLP FREQUENCY: 1-2x/week  SLP DURATION: 12 weeks  PLANNED INTERVENTIONS: Language facilitation, Functional tasks, Multimodal communication approach, SLP instruction and feedback, Compensatory strategies, and Patient/family education    Malcomb Gangemi B. Dreama Saa, M.S., CCC-SLP, Tree surgeon Certified Brain Injury Specialist   [facility] Rehabilitation Services Office [phone]  Ascom [phone] Fax [phone]

## 2023-03-30 ENCOUNTER — Ambulatory Visit: Payer: Medicare HMO | Admitting: Speech Pathology

## 2023-03-30 DIAGNOSIS — R4701 Aphasia: Secondary | ICD-10-CM | POA: Diagnosis not present

## 2023-03-30 DIAGNOSIS — I63512 Cerebral infarction due to unspecified occlusion or stenosis of left middle cerebral artery: Secondary | ICD-10-CM

## 2023-03-30 NOTE — Therapy (Signed)
OUTPATIENT SPEECH LANGUAGE PATHOLOGY  APHASIA TREATMENT NOTE    Patient Name: Lawrence Wong MRN: [id number] DOB:10/31/1940, 82 y.o., male Today's Date: 03/30/2023   PCP: Mort Sawyers, FNP REFERRING PROVIDER: Mort Sawyers, FNP   End of Session - 03/30/23 1322     Visit Number 15    Number of Visits 25    Date for SLP Re-Evaluation 05/04/23    Authorization Type [employer] Medicare HMO/PPO    Progress Note Due on Visit 20    SLP Start Time 1315    SLP Stop Time  1400    SLP Time Calculation (min) 45 min    Activity Tolerance Patient tolerated treatment well              Past Medical History:  Diagnosis Date   Allergy    Arthritis    Asthma    Chicken pox    Chronic kidney disease    Colon polyps    Diabetes mellitus without complication (HCC)    GERD (gastroesophageal reflux disease)    Heart murmur    Hyperlipidemia    Hypertension    Prostate cancer (HCC)    Syphilis    Past Surgical History:  Procedure Laterality Date   INGUINAL LYMPH NODE BIOPSY     PROSTATE BIOPSY     Patient Active Problem List   Diagnosis Date Noted   Primary hypertension 10/18/2022   Cerebrovascular accident (CVA) due to occlusion of left middle cerebral artery (HCC) 07/19/2022   Iron deficiency anemia 07/19/2022   Mucocele of nasal sinus 07/19/2022   Ischemic stroke (HCC) 05/22/2022   Type 2 diabetes mellitus (HCC) 05/22/2022   Hematuria 05/07/2022   Prediabetes 01/13/2022   Recurrent prostate adenocarcinoma (HCC) 06/13/2020   History of colonic polyps 06/12/2020   Gastro-esophageal reflux disease without esophagitis 06/12/2020   Atherosclerosis of aorta (HCC) 01/10/2020   Mild cognitive disorder 07/26/2019   Hyperlipidemia 07/09/2019   Arthritis 07/09/2019   Malignant neoplasm of prostate (HCC) 02/09/2018    ONSET DATE: 05/22/2022; Date of referral  11/24/2022  REFERRING DIAG: Z86.73 (ICD-10-CM) - Personal history of transient ischemic attack (TIA), and cerebral  infarction without residual deficits   THERAPY DIAG:  Aphasia  Cerebrovascular accident (CVA) due to occlusion of left middle cerebral artery (HCC)  Rationale for Evaluation and Treatment Rehabilitation  SUBJECTIVE:   PERTINENT HISTORY: Pt is an 82 year old right handed male with past medical history of L MCA CVA on 05/22/2022 when he was admited to [facility] d/t global aphasia and confusion. 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 In addition, pt with history of T2DM, colonic polyps, and recurrent prostate adenocarcinoma.    DIAGNOSTIC FINDINGS:  05/23/2022 MRI BRAIN W WO CONTRAST IMPRESSION: Expected evolution of multifocal infarcts.  No evidence of underlying mass lesion or abnormal enhancement.   05/22/2022 MRI BRAIN WO CONTRAST IMPRESSION: Acute infarction of the left frontal, insula, and anterior temporal pole. Additional punctate infarctions in the right frontal and parietal lobe.  Right ethmoid air cell mucocele, when compared to recent CT there is probable dehiscence of the lamina papyracea. Further evaluation with CT sinus can be considered for further characterization as clinically indicated.   05/22/2022 CTA HEAD W CONTRAST IMPRESSION: No acute intracranial hemorrhage or mass. There is an abrupt cut off of 1 of the anterior left M3 segment near the left frontal corona radiata (series 605 image 78) with associated punctate radiodensity on series 3 image 18 which may represent embolic calcified material. There  is an adjacent hypodensity within the left frontal corona radiata which may be acute. The osseous structures are unremarkable.  No large vessel occlusion or stenosis. No aneurysm visualized.    PAIN:  Are you having pain? No  FALLS: Has patient fallen in last 6 months?  No  LIVING ENVIRONMENT: Lives with: living with a friend while he is getting better Lives in: House/apartment  PLOF:  Level of assistance: Independent with ADLs, Independent with  IADLs Employment: Retired   PATIENT GOALS    to improve expressive communication   SUBJECTIVE STATEMENT: "I have been raking leaves" Pt accompanied by: friend who stayed in lobby  OBJECTIVE:  TODAY'S TREATMENT:  Skilled treatment session focused on pt's aphasia goals. SLP facilitated session by providing the following interventions:  To target improved word finding, the [place] [facility]: Word finding - Building Categorization Skills p 28 -29 - pt required Maximal multimodal assistance today - he continued to say "I just can't think" Pt continued with perseverative spelling of the categories listed - during conversation, his speech and word finding abilities were very functional today   PATIENT EDUCATION: Education details: word finding deficits Person educated: Patient Education method: Explanation Education comprehension: verbalized understanding and needs further education  HOME EXERCISE PROGRAM:   N/A    GOALS:  Goals reviewed with patient? Yes  SHORT TERM GOALS: Target date: 10 sessions  With Min A, patient will complete a semantic feature analysis with at least 3 relevant features for 5/5 target words with minimal cues to improve word-finding skills.  Baseline: Goal status: INITIAL: progress made - 75%  2.  With Min A, pt will list 10 items in semi-complex category.  Baseline:  Goal status: INITIAL: MET  3.  With Min A, patient will name abstract words and phrases from description at 90% accuracy.   Baseline:  Goal status: INITIAL: MET   LONG TERM GOALS: Target date: 05/04/2023  With Rare Min A, patient will generate sentences with 3 or more words in response to a situation at 80% accuracy in order to increase ability to communicate basic wants and needs.  Baseline:  Goal status: INITIAL: progress made  2.  With Supervision A, patient will use a circumlocution strategy, writing, drawing, and/or gesturing to describe target words with 90% accuracy to improve  word-finding and reduce communication breakdowns, Baseline:  Goal status: INITIAL: progress made  3.  With Supervision A, patient/family will demonstrate understanding of the following concepts: aphasia, spontaneous recovery, communication vs conversation, strengths/strategies to promote success, local resources by answering multiple choice questions with 80% accuracy when provided supported conversation in order to increase patient's participation in medical care.  Baseline:  Goal status: INITIAL: progress made   ASSESSMENT:  CLINICAL IMPRESSION: Patient is a 82 y.o. 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 Pt with increased struggle when completing today's tasks. Uncertain as to why pt was more perseverative.  See the above treatment note for details.   OBJECTIVE IMPAIRMENTS include expressive language. These impairments are limiting patient from managing medications, managing appointments, managing finances, household responsibilities, ADLs/IADLs, and effectively communicating at home and in community. Factors affecting potential to achieve goals and functional outcome are  N/A . Patient will benefit from skilled SLP services to address above impairments and improve overall function.  REHAB POTENTIAL: Good  PLAN: SLP FREQUENCY: 1-2x/week  SLP DURATION: 12 weeks  PLANNED INTERVENTIONS: Language facilitation, Functional tasks, Multimodal communication approach, SLP instruction  and feedback, Compensatory strategies, and Patient/family education    Thoams Siefert B. Dreama Saa, M.S., CCC-SLP, Tree surgeon Certified Brain Injury Specialist [facility]  [facility] Rehabilitation Services Office [phone] Ascom [phone] Fax [phone]

## 2023-04-04 ENCOUNTER — Ambulatory Visit: Payer: Medicare HMO | Attending: Family | Admitting: Speech Pathology

## 2023-04-04 DIAGNOSIS — I63512 Cerebral infarction due to unspecified occlusion or stenosis of left middle cerebral artery: Secondary | ICD-10-CM | POA: Diagnosis not present

## 2023-04-04 DIAGNOSIS — R4701 Aphasia: Secondary | ICD-10-CM | POA: Diagnosis not present

## 2023-04-04 NOTE — Therapy (Signed)
OUTPATIENT SPEECH LANGUAGE PATHOLOGY  APHASIA TREATMENT NOTE    Patient Name: Lawrence Wong MRN: [id number] DOB:July 15, 1940, 82 y.o., male Today's Date: 04/04/2023   PCP: Mort Sawyers, FNP REFERRING PROVIDER: Mort Sawyers, FNP   End of Session - 04/04/23 0935     Visit Number 16    Number of Visits 25    Date for SLP Re-Evaluation 05/04/23    Authorization Type [employer] Medicare HMO/PPO    Progress Note Due on Visit 20    SLP Start Time 0930    SLP Stop Time  1015    SLP Time Calculation (min) 45 min    Activity Tolerance Patient tolerated treatment well              Past Medical History:  Diagnosis Date   Allergy    Arthritis    Asthma    Chicken pox    Chronic kidney disease    Colon polyps    Diabetes mellitus without complication (HCC)    GERD (gastroesophageal reflux disease)    Heart murmur    Hyperlipidemia    Hypertension    Prostate cancer (HCC)    Syphilis    Past Surgical History:  Procedure Laterality Date   INGUINAL LYMPH NODE BIOPSY     PROSTATE BIOPSY     Patient Active Problem List   Diagnosis Date Noted   Primary hypertension 10/18/2022   Cerebrovascular accident (CVA) due to occlusion of left middle cerebral artery (HCC) 07/19/2022   Iron deficiency anemia 07/19/2022   Mucocele of nasal sinus 07/19/2022   Ischemic stroke (HCC) 05/22/2022   Type 2 diabetes mellitus (HCC) 05/22/2022   Hematuria 05/07/2022   Prediabetes 01/13/2022   Recurrent prostate adenocarcinoma (HCC) 06/13/2020   History of colonic polyps 06/12/2020   Gastro-esophageal reflux disease without esophagitis 06/12/2020   Atherosclerosis of aorta (HCC) 01/10/2020   Mild cognitive disorder 07/26/2019   Hyperlipidemia 07/09/2019   Arthritis 07/09/2019   Malignant neoplasm of prostate (HCC) 02/09/2018    ONSET DATE: 05/22/2022; Date of referral  11/24/2022  REFERRING DIAG: Z86.73 (ICD-10-CM) - Personal history of transient ischemic attack (TIA), and cerebral  infarction without residual deficits   THERAPY DIAG:  Aphasia  Cerebrovascular accident (CVA) due to occlusion of left middle cerebral artery (HCC)  Rationale for Evaluation and Treatment Rehabilitation  SUBJECTIVE:   PERTINENT HISTORY: Pt is an 82 year old right handed male with past medical history of L MCA CVA on 05/22/2022 when he was admited to [facility] d/t global aphasia and confusion. 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 In addition, pt with history of T2DM, colonic polyps, and recurrent prostate adenocarcinoma.    DIAGNOSTIC FINDINGS:  05/23/2022 MRI BRAIN W WO CONTRAST IMPRESSION: Expected evolution of multifocal infarcts.  No evidence of underlying mass lesion or abnormal enhancement.   05/22/2022 MRI BRAIN WO CONTRAST IMPRESSION: Acute infarction of the left frontal, insula, and anterior temporal pole. Additional punctate infarctions in the right frontal and parietal lobe.  Right ethmoid air cell mucocele, when compared to recent CT there is probable dehiscence of the lamina papyracea. Further evaluation with CT sinus can be considered for further characterization as clinically indicated.   05/22/2022 CTA HEAD W CONTRAST IMPRESSION: No acute intracranial hemorrhage or mass. There is an abrupt cut off of 1 of the anterior left M3 segment near the left frontal corona radiata (series 605 image 78) with associated punctate radiodensity on series 3 image 18 which may represent embolic calcified material.  There is an adjacent hypodensity within the left frontal corona radiata which may be acute. The osseous structures are unremarkable.  No large vessel occlusion or stenosis. No aneurysm visualized.    PAIN:  Are you having pain? No  FALLS: Has patient fallen in last 6 months?  No  LIVING ENVIRONMENT: Lives with: living with a friend while he is getting better Lives in: House/apartment  PLOF:  Level of assistance: Independent with ADLs, Independent with  IADLs Employment: Retired   PATIENT GOALS    to improve expressive communication   SUBJECTIVE STATEMENT: Pt moved today's appointment to one in the morning because he has an appt with "bone doctor" this afternoon Pt accompanied by: friend who stayed in lobby  OBJECTIVE:  TODAY'S TREATMENT:  Skilled treatment session focused on pt's aphasia goals. SLP facilitated session by providing the following interventions:  To increase pt's word finding ability - SLP utilized p 29 Targeting Word Retrieval Categories. Pt instructed to write dow pieces of equipment that the person listed would need to perform their jobs. Persons included house painter, hair stylist. Pt required moderate assistance to complete with 75% accuracy. In addition, pt was asked to list items that could be found in various places such as the [facility], emergency center (p 40). With moderate assistance, pt able to list items with 75% accuracy.    PATIENT EDUCATION: Education details: word finding deficits Person educated: Patient Education method: Explanation Education comprehension: verbalized understanding and needs further education  HOME EXERCISE PROGRAM:   N/A    GOALS:  Goals reviewed with patient? Yes  SHORT TERM GOALS: Target date: 10 sessions  With Min A, patient will complete a semantic feature analysis with at least 3 relevant features for 5/5 target words with minimal cues to improve word-finding skills.  Baseline: Goal status: INITIAL: progress made - 75%  2.  With Min A, pt will list 10 items in semi-complex category.  Baseline:  Goal status: INITIAL: MET  3.  With Min A, patient will name abstract words and phrases from description at 90% accuracy.   Baseline:  Goal status: INITIAL: MET   LONG TERM GOALS: Target date: 05/04/2023  With Rare Min A, patient will generate sentences with 3 or more words in response to a situation at 80% accuracy in order to increase ability to communicate basic  wants and needs.  Baseline:  Goal status: INITIAL: progress made  2.  With Supervision A, patient will use a circumlocution strategy, writing, drawing, and/or gesturing to describe target words with 90% accuracy to improve word-finding and reduce communication breakdowns, Baseline:  Goal status: INITIAL: progress made  3.  With Supervision A, patient/family will demonstrate understanding of the following concepts: aphasia, spontaneous recovery, communication vs conversation, strengths/strategies to promote success, local resources by answering multiple choice questions with 80% accuracy when provided supported conversation in order to increase patient's participation in medical care.  Baseline:  Goal status: INITIAL: progress made   ASSESSMENT:  CLINICAL IMPRESSION: Patient is a 82 y.o. 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 Pt continues with likely baseline deficits in spelling and acute semantic preseverations.  See the above treatment note for details.   OBJECTIVE IMPAIRMENTS include expressive language. These impairments are limiting patient from managing medications, managing appointments, managing finances, household responsibilities, ADLs/IADLs, and effectively communicating at home and in community. Factors affecting potential to achieve goals and functional outcome are  N/A . Patient  will benefit from skilled SLP services to address above impairments and improve overall function.  REHAB POTENTIAL: Good  PLAN: SLP FREQUENCY: 1-2x/week  SLP DURATION: 12 weeks  PLANNED INTERVENTIONS: Language facilitation, Functional tasks, Multimodal communication approach, SLP instruction and feedback, Compensatory strategies, and Patient/family education    Advik Weatherspoon B. Dreama Saa, M.S., CCC-SLP, Tree surgeon Certified Brain Injury Specialist [facility]  [facility] Rehabilitation  Services Office [phone] Ascom [phone] Fax [phone]

## 2023-04-06 ENCOUNTER — Ambulatory Visit: Payer: Medicare HMO | Admitting: Speech Pathology

## 2023-04-07 ENCOUNTER — Ambulatory Visit: Payer: Medicare HMO | Admitting: Speech Pathology

## 2023-04-07 DIAGNOSIS — I63512 Cerebral infarction due to unspecified occlusion or stenosis of left middle cerebral artery: Secondary | ICD-10-CM | POA: Diagnosis not present

## 2023-04-07 DIAGNOSIS — R4701 Aphasia: Secondary | ICD-10-CM

## 2023-04-07 NOTE — Therapy (Signed)
OUTPATIENT SPEECH LANGUAGE PATHOLOGY  APHASIA TREATMENT NOTE    Patient Name: Lawrence Wong MRN: [id number] DOB:[birth year], 82 y.o., male Today's Date: 04/07/2023   PCP: Mort Sawyers, FNP REFERRING PROVIDER: Mort Sawyers, FNP   End of Session - 04/07/23 1023     Visit Number 17    Number of Visits 25    Date for SLP Re-Evaluation 05/04/23    Authorization Type [employer] Medicare HMO/PPO    Progress Note Due on Visit 20    SLP Start Time 1015    SLP Stop Time  1100    SLP Time Calculation (min) 45 min    Activity Tolerance Patient tolerated treatment well              Past Medical History:  Diagnosis Date   Allergy    Arthritis    Asthma    Chicken pox    Chronic kidney disease    Colon polyps    Diabetes mellitus without complication (HCC)    GERD (gastroesophageal reflux disease)    Heart murmur    Hyperlipidemia    Hypertension    Prostate cancer (HCC)    Syphilis    Past Surgical History:  Procedure Laterality Date   INGUINAL LYMPH NODE BIOPSY     PROSTATE BIOPSY     Patient Active Problem List   Diagnosis Date Noted   Primary hypertension 10/18/2022   Cerebrovascular accident (CVA) due to occlusion of left middle cerebral artery (HCC) 07/19/2022   Iron deficiency anemia 07/19/2022   Mucocele of nasal sinus 07/19/2022   Ischemic stroke (HCC) 05/22/2022   Type 2 diabetes mellitus (HCC) 05/22/2022   Hematuria 05/07/2022   Prediabetes 01/13/2022   Recurrent prostate adenocarcinoma (HCC) 06/13/2020   History of colonic polyps 06/12/2020   Gastro-esophageal reflux disease without esophagitis 06/12/2020   Atherosclerosis of aorta (HCC) 01/10/2020   Mild cognitive disorder 07/26/2019   Hyperlipidemia 07/09/2019   Arthritis 07/09/2019   Malignant neoplasm of prostate (HCC) 02/09/2018    ONSET DATE: 05/22/2022; Date of referral  11/24/2022  REFERRING DIAG: Z86.73 (ICD-10-CM) - Personal history of transient ischemic attack (TIA), and cerebral  infarction without residual deficits   THERAPY DIAG:  Aphasia  Cerebrovascular accident (CVA) due to occlusion of left middle cerebral artery (HCC)  Rationale for Evaluation and Treatment Rehabilitation  SUBJECTIVE:   PERTINENT HISTORY: Pt is an 82 year old right handed male with past medical history of L MCA CVA on 05/22/2022 when he was admited to [facility] d/t global aphasia and confusion. 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 In addition, pt with history of T2DM, colonic polyps, and recurrent prostate adenocarcinoma.    DIAGNOSTIC FINDINGS:  05/23/2022 MRI BRAIN W WO CONTRAST IMPRESSION: Expected evolution of multifocal infarcts.  No evidence of underlying mass lesion or abnormal enhancement.   05/22/2022 MRI BRAIN WO CONTRAST IMPRESSION: Acute infarction of the left frontal, insula, and anterior temporal pole. Additional punctate infarctions in the right frontal and parietal lobe.  Right ethmoid air cell mucocele, when compared to recent CT there is probable dehiscence of the lamina papyracea. Further evaluation with CT sinus can be considered for further characterization as clinically indicated.   05/22/2022 CTA HEAD W CONTRAST IMPRESSION: No acute intracranial hemorrhage or mass. There is an abrupt cut off of 1 of the anterior left M3 segment near the left frontal corona radiata (series 605 image 78) with associated punctate radiodensity on series 3 image 18 which may represent embolic calcified material.  There is an adjacent hypodensity within the left frontal corona radiata which may be acute. The osseous structures are unremarkable.  No large vessel occlusion or stenosis. No aneurysm visualized.    PAIN:  Are you having pain? No  FALLS: Has patient fallen in last 6 months?  No  LIVING ENVIRONMENT: Lives with: living with a friend while he is getting better Lives in: House/apartment  PLOF:  Level of assistance: Independent with ADLs, Independent with  IADLs Employment: Retired   PATIENT GOALS    to improve expressive communication   SUBJECTIVE STATEMENT: Pt with small talk about the weather, "keepin' up with the leaves that are falling" Pt accompanied by: friend who stayed in lobby  OBJECTIVE:  TODAY'S TREATMENT:  Skilled treatment session focused on pt's aphasia goals. SLP facilitated session by providing the following interventions:  To increase pt's word finding ability - SLP utilized p 12 Just for Adults: Concrete Categories - Matching Across Columns -  Pt independent to achieve 100%; p15 Locate Similar Category Items - 100% independent accuracy; p26 Exclusion - which doesn't belong? 90% independently; p29 Provide a category member given first letter - 80% with Minimal assistance   PATIENT EDUCATION: Education details: word finding deficits Person educated: Patient Education method: Explanation Education comprehension: verbalized understanding and needs further education  HOME EXERCISE PROGRAM:   N/A    GOALS:  Goals reviewed with patient? Yes  SHORT TERM GOALS: Target date: 10 sessions  With Min A, patient will complete a semantic feature analysis with at least 3 relevant features for 5/5 target words with minimal cues to improve word-finding skills.  Baseline: Goal status: INITIAL: progress made - 75%  2.  With Min A, pt will list 10 items in semi-complex category.  Baseline:  Goal status: INITIAL: MET  3.  With Min A, patient will name abstract words and phrases from description at 90% accuracy.   Baseline:  Goal status: INITIAL: MET   LONG TERM GOALS: Target date: 05/04/2023  With Rare Min A, patient will generate sentences with 3 or more words in response to a situation at 80% accuracy in order to increase ability to communicate basic wants and needs.  Baseline:  Goal status: INITIAL: progress made  2.  With Supervision A, patient will use a circumlocution strategy, writing, drawing, and/or gesturing  to describe target words with 90% accuracy to improve word-finding and reduce communication breakdowns, Baseline:  Goal status: INITIAL: progress made  3.  With Supervision A, patient/family will demonstrate understanding of the following concepts: aphasia, spontaneous recovery, communication vs conversation, strengths/strategies to promote success, local resources by answering multiple choice questions with 80% accuracy when provided supported conversation in order to increase patient's participation in medical care.  Baseline:  Goal status: INITIAL: progress made   ASSESSMENT:  CLINICAL IMPRESSION: Patient is a 82 y.o. 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 Pt continues with likely baseline deficits in spelling and acute semantic preseverations.  Pt benefited from concrete categories and word finding activities today.  See the above treatment note for details.   OBJECTIVE IMPAIRMENTS include expressive language. These impairments are limiting patient from managing medications, managing appointments, managing finances, household responsibilities, ADLs/IADLs, and effectively communicating at home and in community. Factors affecting potential to achieve goals and functional outcome are  N/A . Patient will benefit from skilled SLP services to address above impairments and improve overall function.  REHAB POTENTIAL: Good  PLAN:  SLP FREQUENCY: 1-2x/week  SLP DURATION: 12 weeks  PLANNED INTERVENTIONS: Language facilitation, Functional tasks, Multimodal communication approach, SLP instruction and feedback, Compensatory strategies, and Patient/family education    Channa Hazelett B. Dreama Saa, M.S., CCC-SLP, Tree surgeon Certified Brain Injury Specialist [facility]  [facility] Rehabilitation Services Office [phone] Ascom [phone] Fax [phone]

## 2023-04-11 ENCOUNTER — Ambulatory Visit: Payer: Medicare HMO | Admitting: Speech Pathology

## 2023-04-13 ENCOUNTER — Ambulatory Visit: Payer: Medicare HMO | Admitting: Speech Pathology

## 2023-04-15 ENCOUNTER — Ambulatory Visit: Payer: Medicare HMO | Admitting: Speech Pathology

## 2023-04-15 DIAGNOSIS — R4701 Aphasia: Secondary | ICD-10-CM | POA: Diagnosis not present

## 2023-04-15 DIAGNOSIS — I63512 Cerebral infarction due to unspecified occlusion or stenosis of left middle cerebral artery: Secondary | ICD-10-CM | POA: Diagnosis not present

## 2023-04-15 NOTE — Therapy (Signed)
OUTPATIENT SPEECH LANGUAGE PATHOLOGY  APHASIA TREATMENT NOTE    Patient Name: Lawrence Wong MRN: [id number] DOB:Jul 16, 1940, 82 y.o., male Today's Date: 04/15/2023   PCP: Mort Sawyers, FNP REFERRING PROVIDER: Mort Sawyers, FNP   End of Session - 04/15/23 1012     Visit Number 18    Number of Visits 25    Date for SLP Re-Evaluation 05/04/23    Authorization Type [employer] Medicare HMO/PPO    Progress Note Due on Visit 20    SLP Start Time 0930    SLP Stop Time  1015    SLP Time Calculation (min) 45 min    Activity Tolerance Patient tolerated treatment well              Past Medical History:  Diagnosis Date   Allergy    Arthritis    Asthma    Chicken pox    Chronic kidney disease    Colon polyps    Diabetes mellitus without complication (HCC)    GERD (gastroesophageal reflux disease)    Heart murmur    Hyperlipidemia    Hypertension    Prostate cancer (HCC)    Syphilis    Past Surgical History:  Procedure Laterality Date   INGUINAL LYMPH NODE BIOPSY     PROSTATE BIOPSY     Patient Active Problem List   Diagnosis Date Noted   Primary hypertension 10/18/2022   Cerebrovascular accident (CVA) due to occlusion of left middle cerebral artery (HCC) 07/19/2022   Iron deficiency anemia 07/19/2022   Mucocele of nasal sinus 07/19/2022   Ischemic stroke (HCC) 05/22/2022   Type 2 diabetes mellitus (HCC) 05/22/2022   Hematuria 05/07/2022   Prediabetes 01/13/2022   Recurrent prostate adenocarcinoma (HCC) 06/13/2020   History of colonic polyps 06/12/2020   Gastro-esophageal reflux disease without esophagitis 06/12/2020   Atherosclerosis of aorta (HCC) 01/10/2020   Mild cognitive disorder 07/26/2019   Hyperlipidemia 07/09/2019   Arthritis 07/09/2019   Malignant neoplasm of prostate (HCC) 02/09/2018    ONSET DATE: 05/22/2022; Date of referral  11/24/2022  REFERRING DIAG: Z86.73 (ICD-10-CM) - Personal history of transient ischemic attack (TIA), and cerebral  infarction without residual deficits   THERAPY DIAG:  Aphasia  Cerebrovascular accident (CVA) due to occlusion of left middle cerebral artery (HCC)  Rationale for Evaluation and Treatment Rehabilitation  SUBJECTIVE:   PERTINENT HISTORY: Pt is an 82 year old right handed male with past medical history of L MCA CVA on 05/22/2022 when he was admited to [facility] d/t global aphasia and confusion. 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 In addition, pt with history of T2DM, colonic polyps, and recurrent prostate adenocarcinoma.    DIAGNOSTIC FINDINGS:  05/23/2022 MRI BRAIN W WO CONTRAST IMPRESSION: Expected evolution of multifocal infarcts.  No evidence of underlying mass lesion or abnormal enhancement.   05/22/2022 MRI BRAIN WO CONTRAST IMPRESSION: Acute infarction of the left frontal, insula, and anterior temporal pole. Additional punctate infarctions in the right frontal and parietal lobe.  Right ethmoid air cell mucocele, when compared to recent CT there is probable dehiscence of the lamina papyracea. Further evaluation with CT sinus can be considered for further characterization as clinically indicated.   05/22/2022 CTA HEAD W CONTRAST IMPRESSION: No acute intracranial hemorrhage or mass. There is an abrupt cut off of 1 of the anterior left M3 segment near the left frontal corona radiata (series 605 image 78) with associated punctate radiodensity on series 3 image 18 which may represent embolic calcified material.  There is an adjacent hypodensity within the left frontal corona radiata which may be acute. The osseous structures are unremarkable.  No large vessel occlusion or stenosis. No aneurysm visualized.    PAIN:  Are you having pain? No  FALLS: Has patient fallen in last 6 months?  No  LIVING ENVIRONMENT: Lives with: living with a friend while he is getting better Lives in: House/apartment  PLOF:  Level of assistance: Independent with ADLs, Independent with  IADLs Employment: Retired   PATIENT GOALS    to improve expressive communication   SUBJECTIVE STATEMENT: Pt missed a session d/t appt Pt accompanied by: friend who stayed in lobby  OBJECTIVE:  TODAY'S TREATMENT:  Skilled treatment session focused on pt's aphasia goals. SLP facilitated session by providing the following interventions:  To improve pt's vocabulary, the following pages were used from [employer]: p 52 - Choosing Words with Similar Meanings 980% accuracy improving to 95% with SLP provided sentence level cues); p56 Recognizing Opposites (pt with 75% accuracy increasing to 90% with Moderate A from SLP); p61 Choosing Opposites (pt with 95% accuracy) and p 81 from [facility]: Unit 3: Vocabulary (pt with 90% accuracy)   PATIENT EDUCATION: Education details: word finding deficits Person educated: Patient Education method: Explanation Education comprehension: verbalized understanding and needs further education  HOME EXERCISE PROGRAM:   N/A    GOALS:  Goals reviewed with patient? Yes  SHORT TERM GOALS: Target date: 10 sessions  With Min A, patient will complete a semantic feature analysis with at least 3 relevant features for 5/5 target words with minimal cues to improve word-finding skills.  Baseline: Goal status: INITIAL: progress made - 75%  2.  With Min A, pt will list 10 items in semi-complex category.  Baseline:  Goal status: INITIAL: MET  3.  With Min A, patient will name abstract words and phrases from description at 90% accuracy.   Baseline:  Goal status: INITIAL: MET   LONG TERM GOALS: Target date: 05/04/2023  With Rare Min A, patient will generate sentences with 3 or more words in response to a situation at 80% accuracy in order to increase ability to communicate basic wants and needs.  Baseline:  Goal status: INITIAL: progress made  2.  With Supervision A, patient will use a circumlocution strategy, writing, drawing, and/or gesturing to  describe target words with 90% accuracy to improve word-finding and reduce communication breakdowns, Baseline:  Goal status: INITIAL: progress made  3.  With Supervision A, patient/family will demonstrate understanding of the following concepts: aphasia, spontaneous recovery, communication vs conversation, strengths/strategies to promote success, local resources by answering multiple choice questions with 80% accuracy when provided supported conversation in order to increase patient's participation in medical care.  Baseline:  Goal status: INITIAL: progress made   ASSESSMENT:  CLINICAL IMPRESSION: Patient is a 82 y.o. 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    See the above treatment note for details.   OBJECTIVE IMPAIRMENTS include expressive language. These impairments are limiting patient from managing medications, managing appointments, managing finances, household responsibilities, ADLs/IADLs, and effectively communicating at home and in community. Factors affecting potential to achieve goals and functional outcome are  N/A . Patient will benefit from skilled SLP services to address above impairments and improve overall function.  REHAB POTENTIAL: Good  PLAN: SLP FREQUENCY: 1-2x/week  SLP DURATION: 12 weeks  PLANNED INTERVENTIONS: Language facilitation, Functional tasks, Multimodal communication approach, SLP instruction and  feedback, Compensatory strategies, and Patient/family education    Laconda Basich B. Dreama Saa, M.S., CCC-SLP, Tree surgeon Certified Brain Injury Specialist [facility]  [facility] Rehabilitation Services Office [phone] Ascom [phone] Fax [phone]

## 2023-04-18 ENCOUNTER — Ambulatory Visit: Payer: Medicare HMO | Admitting: Speech Pathology

## 2023-04-19 DIAGNOSIS — I3481 Nonrheumatic mitral (valve) annulus calcification: Secondary | ICD-10-CM | POA: Diagnosis not present

## 2023-04-19 DIAGNOSIS — R4701 Aphasia: Secondary | ICD-10-CM | POA: Diagnosis not present

## 2023-04-19 DIAGNOSIS — Z79899 Other long term (current) drug therapy: Secondary | ICD-10-CM | POA: Diagnosis not present

## 2023-04-19 DIAGNOSIS — C61 Malignant neoplasm of prostate: Secondary | ICD-10-CM | POA: Diagnosis not present

## 2023-04-19 DIAGNOSIS — I340 Nonrheumatic mitral (valve) insufficiency: Secondary | ICD-10-CM | POA: Diagnosis not present

## 2023-04-19 DIAGNOSIS — E785 Hyperlipidemia, unspecified: Secondary | ICD-10-CM | POA: Diagnosis not present

## 2023-04-19 DIAGNOSIS — I10 Essential (primary) hypertension: Secondary | ICD-10-CM | POA: Diagnosis not present

## 2023-04-19 DIAGNOSIS — E119 Type 2 diabetes mellitus without complications: Secondary | ICD-10-CM | POA: Diagnosis not present

## 2023-04-19 DIAGNOSIS — Z7982 Long term (current) use of aspirin: Secondary | ICD-10-CM | POA: Diagnosis not present

## 2023-04-19 DIAGNOSIS — I081 Rheumatic disorders of both mitral and tricuspid valves: Secondary | ICD-10-CM | POA: Diagnosis not present

## 2023-04-20 ENCOUNTER — Ambulatory Visit: Payer: Medicare HMO | Admitting: Speech Pathology

## 2023-04-20 DIAGNOSIS — R4701 Aphasia: Secondary | ICD-10-CM | POA: Diagnosis not present

## 2023-04-20 DIAGNOSIS — I63512 Cerebral infarction due to unspecified occlusion or stenosis of left middle cerebral artery: Secondary | ICD-10-CM | POA: Diagnosis not present

## 2023-04-20 NOTE — Therapy (Unsigned)
OUTPATIENT SPEECH LANGUAGE PATHOLOGY  APHASIA TREATMENT NOTE    Patient Name: Lawrence Wong MRN: [id number] DOB:1940/09/17, 82 y.o., male Today's Date: 04/20/2023   PCP: Mort Sawyers, FNP REFERRING PROVIDER: Mort Sawyers, FNP   End of Session - 04/20/23 1315     Visit Number 19    Number of Visits 25    Date for SLP Re-Evaluation 05/04/23    Authorization Type [employer] Medicare HMO/PPO    Progress Note Due on Visit 20    SLP Start Time 1315    SLP Stop Time  1400    SLP Time Calculation (min) 45 min    Activity Tolerance Patient tolerated treatment well              Past Medical History:  Diagnosis Date   Allergy    Arthritis    Asthma    Chicken pox    Chronic kidney disease    Colon polyps    Diabetes mellitus without complication (HCC)    GERD (gastroesophageal reflux disease)    Heart murmur    Hyperlipidemia    Hypertension    Prostate cancer (HCC)    Syphilis    Past Surgical History:  Procedure Laterality Date   INGUINAL LYMPH NODE BIOPSY     PROSTATE BIOPSY     Patient Active Problem List   Diagnosis Date Noted   Primary hypertension 10/18/2022   Cerebrovascular accident (CVA) due to occlusion of left middle cerebral artery (HCC) 07/19/2022   Iron deficiency anemia 07/19/2022   Mucocele of nasal sinus 07/19/2022   Ischemic stroke (HCC) 05/22/2022   Type 2 diabetes mellitus (HCC) 05/22/2022   Hematuria 05/07/2022   Prediabetes 01/13/2022   Recurrent prostate adenocarcinoma (HCC) 06/13/2020   History of colonic polyps 06/12/2020   Gastro-esophageal reflux disease without esophagitis 06/12/2020   Atherosclerosis of aorta (HCC) 01/10/2020   Mild cognitive disorder 07/26/2019   Hyperlipidemia 07/09/2019   Arthritis 07/09/2019   Malignant neoplasm of prostate (HCC) 02/09/2018    ONSET DATE: 05/22/2022; Date of referral  11/24/2022  REFERRING DIAG: Z86.73 (ICD-10-CM) - Personal history of transient ischemic attack (TIA), and cerebral  infarction without residual deficits   THERAPY DIAG:  Aphasia  Cerebrovascular accident (CVA) due to occlusion of left middle cerebral artery (HCC)  Rationale for Evaluation and Treatment Rehabilitation  SUBJECTIVE:   PERTINENT HISTORY: Pt is an 82 year old right handed male with past medical history of L MCA CVA on 05/22/2022 when he was admited to [facility] d/t global aphasia and confusion. 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 In addition, pt with history of T2DM, colonic polyps, and recurrent prostate adenocarcinoma.    DIAGNOSTIC FINDINGS:  05/23/2022 MRI BRAIN W WO CONTRAST IMPRESSION: Expected evolution of multifocal infarcts.  No evidence of underlying mass lesion or abnormal enhancement.   05/22/2022 MRI BRAIN WO CONTRAST IMPRESSION: Acute infarction of the left frontal, insula, and anterior temporal pole. Additional punctate infarctions in the right frontal and parietal lobe.  Right ethmoid air cell mucocele, when compared to recent CT there is probable dehiscence of the lamina papyracea. Further evaluation with CT sinus can be considered for further characterization as clinically indicated.   05/22/2022 CTA HEAD W CONTRAST IMPRESSION: No acute intracranial hemorrhage or mass. There is an abrupt cut off of 1 of the anterior left M3 segment near the left frontal corona radiata (series 605 image 78) with associated punctate radiodensity on series 3 image 18 which may represent embolic calcified material.  There is an adjacent hypodensity within the left frontal corona radiata which may be acute. The osseous structures are unremarkable.  No large vessel occlusion or stenosis. No aneurysm visualized.    PAIN:  Are you having pain? No  FALLS: Has patient fallen in last 6 months?  No  LIVING ENVIRONMENT: Lives with: living with a friend while he is getting better Lives in: House/apartment  PLOF:  Level of assistance: Independent with ADLs, Independent with  IADLs Employment: Retired   PATIENT GOALS    to improve expressive communication   SUBJECTIVE STATEMENT: "Have you been sick?"  Pt accompanied by: friend who stayed in lobby  OBJECTIVE:  TODAY'S TREATMENT:  Skilled treatment session focused on pt's aphasia goals. SLP facilitated session by providing the following interventions:  Semantic Feature Analysis used to facilitated communication and object description With maximal multi-modal cues and more than a reasonable amount of time, pt able to provide answers to questions regarding physical description, function, location of common animals  .   PATIENT EDUCATION: Education details: word finding deficits Person educated: Patient Education method: Explanation Education comprehension: verbalized understanding and needs further education  HOME EXERCISE PROGRAM:   N/A    GOALS:  Goals reviewed with patient? Yes  SHORT TERM GOALS: Target date: 10 sessions  With Min A, patient will complete a semantic feature analysis with at least 3 relevant features for 5/5 target words with minimal cues to improve word-finding skills.  Baseline: Goal status: INITIAL: progress made - 75%  2.  With Min A, pt will list 10 items in semi-complex category.  Baseline:  Goal status: INITIAL: MET  3.  With Min A, patient will name abstract words and phrases from description at 90% accuracy.   Baseline:  Goal status: INITIAL: MET   LONG TERM GOALS: Target date: 05/04/2023  With Rare Min A, patient will generate sentences with 3 or more words in response to a situation at 80% accuracy in order to increase ability to communicate basic wants and needs.  Baseline:  Goal status: INITIAL: progress made  2.  With Supervision A, patient will use a circumlocution strategy, writing, drawing, and/or gesturing to describe target words with 90% accuracy to improve word-finding and reduce communication breakdowns, Baseline:  Goal status: INITIAL:  progress made  3.  With Supervision A, patient/family will demonstrate understanding of the following concepts: aphasia, spontaneous recovery, communication vs conversation, strengths/strategies to promote success, local resources by answering multiple choice questions with 80% accuracy when provided supported conversation in order to increase patient's participation in medical care.  Baseline:  Goal status: INITIAL: progress made   ASSESSMENT:  CLINICAL IMPRESSION: Patient is a 82 y.o. 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    See the above treatment note for details.   OBJECTIVE IMPAIRMENTS include expressive language. These impairments are limiting patient from managing medications, managing appointments, managing finances, household responsibilities, ADLs/IADLs, and effectively communicating at home and in community. Factors affecting potential to achieve goals and functional outcome are  N/A . Patient will benefit from skilled SLP services to address above impairments and improve overall function.  REHAB POTENTIAL: Good  PLAN: SLP FREQUENCY: 1-2x/week  SLP DURATION: 12 weeks  PLANNED INTERVENTIONS: Language facilitation, Functional tasks, Multimodal communication approach, SLP instruction and feedback, Compensatory strategies, and Patient/family education    Trayvion Embleton B. Dreama Saa, M.S., CCC-SLP, CBIS Speech-Language Pathologist Certified Brain Injury Specialist [place]  [facility]  Rehabilitation Services Office [phone] Ascom [phone] Fax [phone]

## 2023-04-25 ENCOUNTER — Ambulatory Visit: Payer: Medicare HMO | Admitting: Speech Pathology

## 2023-04-25 DIAGNOSIS — I63512 Cerebral infarction due to unspecified occlusion or stenosis of left middle cerebral artery: Secondary | ICD-10-CM | POA: Diagnosis not present

## 2023-04-25 DIAGNOSIS — R4701 Aphasia: Secondary | ICD-10-CM

## 2023-04-25 NOTE — Therapy (Signed)
OUTPATIENT SPEECH LANGUAGE PATHOLOGY  APHASIA TREATMENT NOTE    Patient Name: Lawrence Wong MRN: [id number] DOB:Feb 16, 1941, 82 y.o., male Today's Date: 04/25/2023   PCP: Mort Sawyers, FNP REFERRING PROVIDER: Mort Sawyers, FNP   End of Session - 04/25/23 1319     Visit Number 20    Number of Visits 25    Date for SLP Re-Evaluation 05/04/23    Authorization Type [employer] Medicare HMO/PPO    Progress Note Due on Visit 20    SLP Start Time 1315    SLP Stop Time  1400    SLP Time Calculation (min) 45 min    Activity Tolerance Patient tolerated treatment well              Past Medical History:  Diagnosis Date   Allergy    Arthritis    Asthma    Chicken pox    Chronic kidney disease    Colon polyps    Diabetes mellitus without complication (HCC)    GERD (gastroesophageal reflux disease)    Heart murmur    Hyperlipidemia    Hypertension    Prostate cancer (HCC)    Syphilis    Past Surgical History:  Procedure Laterality Date   INGUINAL LYMPH NODE BIOPSY     PROSTATE BIOPSY     Patient Active Problem List   Diagnosis Date Noted   Primary hypertension 10/18/2022   Cerebrovascular accident (CVA) due to occlusion of left middle cerebral artery (HCC) 07/19/2022   Iron deficiency anemia 07/19/2022   Mucocele of nasal sinus 07/19/2022   Ischemic stroke (HCC) 05/22/2022   Type 2 diabetes mellitus (HCC) 05/22/2022   Hematuria 05/07/2022   Prediabetes 01/13/2022   Recurrent prostate adenocarcinoma (HCC) 06/13/2020   History of colonic polyps 06/12/2020   Gastro-esophageal reflux disease without esophagitis 06/12/2020   Atherosclerosis of aorta (HCC) 01/10/2020   Mild cognitive disorder 07/26/2019   Hyperlipidemia 07/09/2019   Arthritis 07/09/2019   Malignant neoplasm of prostate (HCC) 02/09/2018    ONSET DATE: 05/22/2022; Date of referral  11/24/2022  REFERRING DIAG: Z86.73 (ICD-10-CM) - Personal history of transient ischemic attack (TIA), and cerebral  infarction without residual deficits   THERAPY DIAG:  Aphasia  Cerebrovascular accident (CVA) due to occlusion of left middle cerebral artery (HCC)  Rationale for Evaluation and Treatment Rehabilitation  SUBJECTIVE:   PERTINENT HISTORY: Pt is an 82 year old right handed male with past medical history of L MCA CVA on 05/22/2022 when he was admited to [facility] d/t global aphasia and confusion. 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 In addition, pt with history of T2DM, colonic polyps, and recurrent prostate adenocarcinoma.    DIAGNOSTIC FINDINGS:  05/23/2022 MRI BRAIN W WO CONTRAST IMPRESSION: Expected evolution of multifocal infarcts.  No evidence of underlying mass lesion or abnormal enhancement.   05/22/2022 MRI BRAIN WO CONTRAST IMPRESSION: Acute infarction of the left frontal, insula, and anterior temporal pole. Additional punctate infarctions in the right frontal and parietal lobe.  Right ethmoid air cell mucocele, when compared to recent CT there is probable dehiscence of the lamina papyracea. Further evaluation with CT sinus can be considered for further characterization as clinically indicated.   05/22/2022 CTA HEAD W CONTRAST IMPRESSION: No acute intracranial hemorrhage or mass. There is an abrupt cut off of 1 of the anterior left M3 segment near the left frontal corona radiata (series 605 image 78) with associated punctate radiodensity on series 3 image 18 which may represent embolic calcified material.  There is an adjacent hypodensity within the left frontal corona radiata which may be acute. The osseous structures are unremarkable.  No large vessel occlusion or stenosis. No aneurysm visualized.    PAIN:  Are you having pain? No  FALLS: Has patient fallen in last 6 months?  No  LIVING ENVIRONMENT: Lives with: living with a friend while he is getting better Lives in: House/apartment  PLOF:  Level of assistance: Independent with ADLs, Independent with  IADLs Employment: Retired   PATIENT GOALS    to improve expressive communication   SUBJECTIVE STATEMENT: "Have you been sick?"  Pt accompanied by: friend who stayed in lobby  OBJECTIVE:  TODAY'S TREATMENT:  Skilled treatment session focused on pt's aphasia goals. SLP facilitated session by providing the following interventions:  [employer] was administered. Pt scored 51/60. This is above the average range. (Norms for adults: 18-39 55.8, SD 3.8, 40-49 56.8, SD 3.0, 50-59 55.2, SD 4.0, 60-69 53.3, SD 4.6, 70-79 48.9, SD 6.3)   PATIENT EDUCATION: Education details: word finding deficits Person educated: Patient Education method: Explanation Education comprehension: verbalized understanding and needs further education  HOME EXERCISE PROGRAM:   N/A    GOALS:  Goals reviewed with patient? Yes  SHORT TERM GOALS: Target date: 10 sessions  With Min A, patient will complete a semantic feature analysis with at least 3 relevant features for 5/5 target words with minimal cues to improve word-finding skills.  Baseline: Goal status: INITIAL: progress made - 75%: progress made  2.  With Min A, pt will list 10 items in semi-complex category.  Baseline:  Goal status: INITIAL: MET  3.  With Min A, patient will name abstract words and phrases from description at 90% accuracy.   Baseline:  Goal status: INITIAL: MET   LONG TERM GOALS: Target date: 05/04/2023  With Rare Min A, patient will generate sentences with 3 or more words in response to a situation at 80% accuracy in order to increase ability to communicate basic wants and needs.  Baseline:  Goal status: INITIAL: progress made  2.  With Supervision A, patient will use a circumlocution strategy, writing, drawing, and/or gesturing to describe target words with 90% accuracy to improve word-finding and reduce communication breakdowns, Baseline:  Goal status: INITIAL: progress made  3.   With Supervision A, patient/family will demonstrate understanding of the following concepts: aphasia, spontaneous recovery, communication vs conversation, strengths/strategies to promote success, local resources by answering multiple choice questions with 80% accuracy when provided supported conversation in order to increase patient's participation in medical care.  Baseline:  Goal status: INITIAL: progress made   ASSESSMENT:  CLINICAL IMPRESSION: Patient is a 82 y.o. 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  This Clinical research associate spoke with pt's friend who reports that pt has made progress but continues to struggle with communicating his wants/needs.   See the above treatment note for details.   OBJECTIVE IMPAIRMENTS include expressive language. These impairments are limiting patient from managing medications, managing appointments, managing finances, household responsibilities, ADLs/IADLs, and effectively communicating at home and in community. Factors affecting potential to achieve goals and functional outcome are  N/A . Patient will benefit from skilled SLP services to address above impairments and improve overall function.  REHAB POTENTIAL: Good  PLAN: SLP FREQUENCY: 1-2x/week  SLP DURATION: 12 weeks  PLANNED INTERVENTIONS: Language facilitation, Functional tasks, Multimodal communication approach, SLP instruction and feedback,  Compensatory strategies, and Patient/family education    Kamirah Shugrue B. Dreama Saa, M.S., CCC-SLP, Tree surgeon Certified Brain Injury Specialist [facility]  [facility] Rehabilitation Services Office [phone] Ascom [phone] Fax [phone]

## 2023-04-27 ENCOUNTER — Ambulatory Visit: Payer: Medicare HMO | Admitting: Speech Pathology

## 2023-04-27 DIAGNOSIS — I63512 Cerebral infarction due to unspecified occlusion or stenosis of left middle cerebral artery: Secondary | ICD-10-CM

## 2023-04-27 DIAGNOSIS — R4701 Aphasia: Secondary | ICD-10-CM

## 2023-04-27 NOTE — Therapy (Signed)
OUTPATIENT SPEECH LANGUAGE PATHOLOGY  APHASIA TREATMENT NOTE    Patient Name: Lawrence Wong MRN: [id number] DOB:1940/11/29, 82 y.o., male Today's Date: 04/27/2023   PCP: Mort Sawyers, FNP REFERRING PROVIDER: Mort Sawyers, FNP   End of Session - 04/27/23 1450     Visit Number 21    Number of Visits 25    Date for SLP Re-Evaluation 05/04/23    Authorization Type [employer] Medicare HMO/PPO    Progress Note Due on Visit 30    SLP Start Time 1450    SLP Stop Time  1535    SLP Time Calculation (min) 45 min    Activity Tolerance Patient tolerated treatment well              Past Medical History:  Diagnosis Date   Allergy    Arthritis    Asthma    Chicken pox    Chronic kidney disease    Colon polyps    Diabetes mellitus without complication (HCC)    GERD (gastroesophageal reflux disease)    Heart murmur    Hyperlipidemia    Hypertension    Prostate cancer (HCC)    Syphilis    Past Surgical History:  Procedure Laterality Date   INGUINAL LYMPH NODE BIOPSY     PROSTATE BIOPSY     Patient Active Problem List   Diagnosis Date Noted   Primary hypertension 10/18/2022   Cerebrovascular accident (CVA) due to occlusion of left middle cerebral artery (HCC) 07/19/2022   Iron deficiency anemia 07/19/2022   Mucocele of nasal sinus 07/19/2022   Ischemic stroke (HCC) 05/22/2022   Type 2 diabetes mellitus (HCC) 05/22/2022   Hematuria 05/07/2022   Prediabetes 01/13/2022   Recurrent prostate adenocarcinoma (HCC) 06/13/2020   History of colonic polyps 06/12/2020   Gastro-esophageal reflux disease without esophagitis 06/12/2020   Atherosclerosis of aorta (HCC) 01/10/2020   Mild cognitive disorder 07/26/2019   Hyperlipidemia 07/09/2019   Arthritis 07/09/2019   Malignant neoplasm of prostate (HCC) 02/09/2018    ONSET DATE: 05/22/2022; Date of referral  11/24/2022  REFERRING DIAG: Z86.73 (ICD-10-CM) - Personal history of transient ischemic attack (TIA), and cerebral  infarction without residual deficits   THERAPY DIAG:  Cerebrovascular accident (CVA) due to occlusion of left middle cerebral artery (HCC)  Aphasia  Rationale for Evaluation and Treatment Rehabilitation  SUBJECTIVE:   PERTINENT HISTORY: Pt is an 82 year old right handed male with past medical history of L MCA CVA on 05/22/2022 when he was admited to [facility] d/t global aphasia and confusion. 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 In addition, pt with history of T2DM, colonic polyps, and recurrent prostate adenocarcinoma.    DIAGNOSTIC FINDINGS:  05/23/2022 MRI BRAIN W WO CONTRAST IMPRESSION: Expected evolution of multifocal infarcts.  No evidence of underlying mass lesion or abnormal enhancement.   05/22/2022 MRI BRAIN WO CONTRAST IMPRESSION: Acute infarction of the left frontal, insula, and anterior temporal pole. Additional punctate infarctions in the right frontal and parietal lobe.  Right ethmoid air cell mucocele, when compared to recent CT there is probable dehiscence of the lamina papyracea. Further evaluation with CT sinus can be considered for further characterization as clinically indicated.   05/22/2022 CTA HEAD W CONTRAST IMPRESSION: No acute intracranial hemorrhage or mass. There is an abrupt cut off of 1 of the anterior left M3 segment near the left frontal corona radiata (series 605 image 78) with associated punctate radiodensity on series 3 image 18 which may represent embolic calcified material.  There is an adjacent hypodensity within the left frontal corona radiata which may be acute. The osseous structures are unremarkable.  No large vessel occlusion or stenosis. No aneurysm visualized.    PAIN:  Are you having pain? No  FALLS: Has patient fallen in last 6 months?  No  LIVING ENVIRONMENT: Lives with: living with a friend while he is getting better Lives in: House/apartment  PLOF:  Level of assistance: Independent with ADLs, Independent with  IADLs Employment: Retired   PATIENT GOALS    to improve expressive communication   SUBJECTIVE STATEMENT: "Have you been sick?"  Pt accompanied by: friend who stayed in lobby  OBJECTIVE:  TODAY'S TREATMENT:  Skilled treatment session focused on pt's aphasia goals. SLP facilitated session by providing the following interventions:  Generative naming: Pt named x5 items related to Thanksgiving holiday with mod-max hierarchical cueing.   Semantic Features / Circumlocution: Pt provided x2 relevant features when describing pictured objects with min cues; x3-4 features with max cues cues.    PATIENT EDUCATION: Education details: word finding deficits Person educated: Patient Education method: Explanation Education comprehension: verbalized understanding and needs further education  HOME EXERCISE PROGRAM:   N/A    GOALS:  Goals reviewed with patient? Yes  SHORT TERM GOALS: Target date: 10 sessions  With Min A, patient will complete a semantic feature analysis with at least 3 relevant features for 5/5 target words with minimal cues to improve word-finding skills.  Baseline: Goal status: INITIAL: progress made - 75%: progress made  2.  With Min A, pt will list 10 items in semi-complex category.  Baseline:  Goal status: INITIAL: MET  3.  With Min A, patient will name abstract words and phrases from description at 90% accuracy.   Baseline:  Goal status: INITIAL: MET   LONG TERM GOALS: Target date: 05/04/2023  With Rare Min A, patient will generate sentences with 3 or more words in response to a situation at 80% accuracy in order to increase ability to communicate basic wants and needs.  Baseline:  Goal status: INITIAL: progress made  2.  With Supervision A, patient will use a circumlocution strategy, writing, drawing, and/or gesturing to describe target words with 90% accuracy to improve word-finding and reduce communication breakdowns, Baseline:  Goal status: INITIAL:  progress made  3.  With Supervision A, patient/family will demonstrate understanding of the following concepts: aphasia, spontaneous recovery, communication vs conversation, strengths/strategies to promote success, local resources by answering multiple choice questions with 80% accuracy when provided supported conversation in order to increase patient's participation in medical care.  Baseline:  Goal status: INITIAL: progress made   ASSESSMENT:  CLINICAL IMPRESSION: Patient is a 82 y.o. 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    See the above treatment note for details.   OBJECTIVE IMPAIRMENTS include expressive language. These impairments are limiting patient from managing medications, managing appointments, managing finances, household responsibilities, ADLs/IADLs, and effectively communicating at home and in community. Factors affecting potential to achieve goals and functional outcome are  N/A . Patient will benefit from skilled SLP services to address above impairments and improve overall function.  REHAB POTENTIAL: Good  PLAN: SLP FREQUENCY: 1-2x/week  SLP DURATION: 12 weeks  PLANNED INTERVENTIONS: Language facilitation, Functional tasks, Multimodal communication approach, SLP instruction and feedback, Compensatory strategies, and Patient/family education    Clyde Canterbury, M.S., CCC-SLP Speech-Language Pathologist [place] [facility] [phone] (ASCOM)

## 2023-05-02 ENCOUNTER — Ambulatory Visit: Payer: Medicare HMO | Attending: Family | Admitting: Speech Pathology

## 2023-05-02 DIAGNOSIS — I63512 Cerebral infarction due to unspecified occlusion or stenosis of left middle cerebral artery: Secondary | ICD-10-CM | POA: Insufficient documentation

## 2023-05-02 DIAGNOSIS — R4701 Aphasia: Secondary | ICD-10-CM | POA: Insufficient documentation

## 2023-05-02 DIAGNOSIS — R41841 Cognitive communication deficit: Secondary | ICD-10-CM | POA: Diagnosis not present

## 2023-05-02 NOTE — Therapy (Signed)
OUTPATIENT SPEECH LANGUAGE PATHOLOGY  APHASIA TREATMENT NOTE    Patient Name: Lawrence Wong MRN: [id number] DOB:[birth year], 82 y.o., male Today's Date: 05/02/2023   PCP: Mort Sawyers, FNP REFERRING PROVIDER: Mort Sawyers, FNP   End of Session - 05/02/23 1320     Visit Number 22    Number of Visits 25    Date for SLP Re-Evaluation 05/04/23    Authorization Type [employer] Medicare HMO/PPO    Progress Note Due on Visit 30    SLP Start Time 1315    SLP Stop Time  1400    SLP Time Calculation (min) 45 min    Activity Tolerance Patient tolerated treatment well              Past Medical History:  Diagnosis Date   Allergy    Arthritis    Asthma    Chicken pox    Chronic kidney disease    Colon polyps    Diabetes mellitus without complication (HCC)    GERD (gastroesophageal reflux disease)    Heart murmur    Hyperlipidemia    Hypertension    Prostate cancer (HCC)    Syphilis    Past Surgical History:  Procedure Laterality Date   INGUINAL LYMPH NODE BIOPSY     PROSTATE BIOPSY     Patient Active Problem List   Diagnosis Date Noted   Primary hypertension 10/18/2022   Cerebrovascular accident (CVA) due to occlusion of left middle cerebral artery (HCC) 07/19/2022   Iron deficiency anemia 07/19/2022   Mucocele of nasal sinus 07/19/2022   Ischemic stroke (HCC) 05/22/2022   Type 2 diabetes mellitus (HCC) 05/22/2022   Hematuria 05/07/2022   Prediabetes 01/13/2022   Recurrent prostate adenocarcinoma (HCC) 06/13/2020   History of colonic polyps 06/12/2020   Gastro-esophageal reflux disease without esophagitis 06/12/2020   Atherosclerosis of aorta (HCC) 01/10/2020   Mild cognitive disorder 07/26/2019   Hyperlipidemia 07/09/2019   Arthritis 07/09/2019   Malignant neoplasm of prostate (HCC) 02/09/2018    ONSET DATE: 05/22/2022; Date of referral  11/24/2022  REFERRING DIAG: Z86.73 (ICD-10-CM) - Personal history of transient ischemic attack (TIA), and cerebral  infarction without residual deficits   THERAPY DIAG:  Aphasia  Cognitive communication deficit  Cerebrovascular accident (CVA) due to occlusion of left middle cerebral artery (HCC)  Rationale for Evaluation and Treatment Rehabilitation  SUBJECTIVE:   PERTINENT HISTORY: Pt is an 82 year old right handed male with past medical history of L MCA CVA on 05/22/2022 when he was admited to [facility] d/t global aphasia and confusion. 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 In addition, pt with history of T2DM, colonic polyps, and recurrent prostate adenocarcinoma.    DIAGNOSTIC FINDINGS:  05/23/2022 MRI BRAIN W WO CONTRAST IMPRESSION: Expected evolution of multifocal infarcts.  No evidence of underlying mass lesion or abnormal enhancement.   05/22/2022 MRI BRAIN WO CONTRAST IMPRESSION: Acute infarction of the left frontal, insula, and anterior temporal pole. Additional punctate infarctions in the right frontal and parietal lobe.  Right ethmoid air cell mucocele, when compared to recent CT there is probable dehiscence of the lamina papyracea. Further evaluation with CT sinus can be considered for further characterization as clinically indicated.   05/22/2022 CTA HEAD W CONTRAST IMPRESSION: No acute intracranial hemorrhage or mass. There is an abrupt cut off of 1 of the anterior left M3 segment near the left frontal corona radiata (series 605 image 78) with associated punctate radiodensity on series 3 image 18 which may  represent embolic calcified material. There is an adjacent hypodensity within the left frontal corona radiata which may be acute. The osseous structures are unremarkable.  No large vessel occlusion or stenosis. No aneurysm visualized.    PAIN:  Are you having pain? No  FALLS: Has patient fallen in last 6 months?  No  LIVING ENVIRONMENT: Lives with: living with a friend while he is getting better Lives in: House/apartment  PLOF:  Level of assistance:  Independent with ADLs, Independent with IADLs Employment: Retired   PATIENT GOALS    to improve expressive communication   SUBJECTIVE STATEMENT: "So next session is my last?" Pt accompanied by: friend who stayed in lobby  OBJECTIVE:  TODAY'S TREATMENT:  Skilled treatment session focused on pt's aphasia goals. SLP facilitated session by providing the following interventions:  Generative naming: Pt named x 10 items related to what he ate for Thanksgiving max hierarchical cueing.   He also named x 10 items related to Christmas with max hierarchical cueing.    PATIENT EDUCATION: Education details: word finding deficits Person educated: Patient Education method: Explanation Education comprehension: verbalized understanding and needs further education  HOME EXERCISE PROGRAM:   N/A    GOALS:  Goals reviewed with patient? Yes  SHORT TERM GOALS: Target date: 10 sessions  With Min A, patient will complete a semantic feature analysis with at least 3 relevant features for 5/5 target words with minimal cues to improve word-finding skills.  Baseline: Goal status: INITIAL: progress made - 75%: progress made  2.  With Min A, pt will list 10 items in semi-complex category.  Baseline:  Goal status: INITIAL: MET  3.  With Min A, patient will name abstract words and phrases from description at 90% accuracy.   Baseline:  Goal status: INITIAL: MET   LONG TERM GOALS: Target date: 05/04/2023  With Rare Min A, patient will generate sentences with 3 or more words in response to a situation at 80% accuracy in order to increase ability to communicate basic wants and needs.  Baseline:  Goal status: INITIAL: progress made  2.  With Supervision A, patient will use a circumlocution strategy, writing, drawing, and/or gesturing to describe target words with 90% accuracy to improve word-finding and reduce communication breakdowns, Baseline:  Goal status: INITIAL: progress made  3.  With  Supervision A, patient/family will demonstrate understanding of the following concepts: aphasia, spontaneous recovery, communication vs conversation, strengths/strategies to promote success, local resources by answering multiple choice questions with 80% accuracy when provided supported conversation in order to increase patient's participation in medical care.  Baseline:  Goal status: INITIAL: progress made   ASSESSMENT:  CLINICAL IMPRESSION: Patient is a 82 y.o. 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    While pt has made some progress, further progress has been resistant to change. As a result, next session will be pt's last ST treatment. Education completed with pt and his friend on residual deficits and continued required support. See the above treatment note for details.   OBJECTIVE IMPAIRMENTS include expressive language. These impairments are limiting patient from managing medications, managing appointments, managing finances, household responsibilities, ADLs/IADLs, and effectively communicating at home and in community. Factors affecting potential to achieve goals and functional outcome are  N/A . Patient will benefit from skilled SLP services to address above impairments and improve overall function.  REHAB POTENTIAL: Good  PLAN: SLP FREQUENCY: 1-2x/week  SLP DURATION: 12 weeks  PLANNED  INTERVENTIONS: Language facilitation, Functional tasks, Multimodal communication approach, SLP instruction and feedback, Compensatory strategies, and Patient/family education  Aalaiyah Yassin B. Dreama Saa, M.S., CCC-SLP, Tree surgeon Certified Brain Injury Specialist [facility]  [facility] Rehabilitation Services Office [phone] Ascom [phone] Fax [phone]

## 2023-05-04 ENCOUNTER — Ambulatory Visit: Payer: Medicare HMO | Admitting: Speech Pathology

## 2023-05-05 ENCOUNTER — Ambulatory Visit: Payer: Medicare HMO | Admitting: Speech Pathology

## 2023-05-05 DIAGNOSIS — I63512 Cerebral infarction due to unspecified occlusion or stenosis of left middle cerebral artery: Secondary | ICD-10-CM

## 2023-05-05 DIAGNOSIS — R41841 Cognitive communication deficit: Secondary | ICD-10-CM | POA: Diagnosis not present

## 2023-05-05 DIAGNOSIS — R4701 Aphasia: Secondary | ICD-10-CM

## 2023-05-05 NOTE — Therapy (Signed)
OUTPATIENT SPEECH LANGUAGE PATHOLOGY  APHASIA TREATMENT NOTE DISCHARGE SUMMARY    Patient Name: Lawrence Wong MRN: [id number] DOB:1941-03-14, 82 y.o., male Today's Date: 05/05/2023   PCP: Mort Sawyers, FNP REFERRING PROVIDER: Mort Sawyers, FNP   End of Session - 05/05/23 1317     Visit Number 23    Number of Visits 25    Date for SLP Re-Evaluation 05/05/23    Authorization Type [employer] HMO/PPO    Progress Note Due on Visit 30    SLP Start Time 1315    SLP Stop Time  1400    SLP Time Calculation (min) 45 min    Activity Tolerance Patient tolerated treatment well              Past Medical History:  Diagnosis Date   Allergy    Arthritis    Asthma    Chicken pox    Chronic kidney disease    Colon polyps    Diabetes mellitus without complication (HCC)    GERD (gastroesophageal reflux disease)    Heart murmur    Hyperlipidemia    Hypertension    Prostate cancer (HCC)    Syphilis    Past Surgical History:  Procedure Laterality Date   INGUINAL LYMPH NODE BIOPSY     PROSTATE BIOPSY     Patient Active Problem List   Diagnosis Date Noted   Primary hypertension 10/18/2022   Cerebrovascular accident (CVA) due to occlusion of left middle cerebral artery (HCC) 07/19/2022   Iron deficiency anemia 07/19/2022   Mucocele of nasal sinus 07/19/2022   Ischemic stroke (HCC) 05/22/2022   Type 2 diabetes mellitus (HCC) 05/22/2022   Hematuria 05/07/2022   Prediabetes 01/13/2022   Recurrent prostate adenocarcinoma (HCC) 06/13/2020   History of colonic polyps 06/12/2020   Gastro-esophageal reflux disease without esophagitis 06/12/2020   Atherosclerosis of aorta (HCC) 01/10/2020   Mild cognitive disorder 07/26/2019   Hyperlipidemia 07/09/2019   Arthritis 07/09/2019   Malignant neoplasm of prostate (HCC) 02/09/2018    ONSET DATE: 05/22/2022; Date of referral  11/24/2022  REFERRING DIAG: Z86.73 (ICD-10-CM) - Personal history of transient ischemic attack  (TIA), and cerebral infarction without residual deficits   THERAPY DIAG:  Aphasia  Cognitive communication deficit  Cerebrovascular accident (CVA) due to occlusion of left middle cerebral artery (HCC)  Rationale for Evaluation and Treatment Rehabilitation  SUBJECTIVE:   PERTINENT HISTORY: Pt is an 82 year old right handed male with past medical history of L MCA CVA on 05/22/2022 when he was admited to [facility] d/t global aphasia and confusion. 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 In addition, pt with history of T2DM, colonic polyps, and recurrent prostate adenocarcinoma.    DIAGNOSTIC FINDINGS:  05/23/2022 MRI BRAIN W WO CONTRAST IMPRESSION: Expected evolution of multifocal infarcts.  No evidence of underlying mass lesion or abnormal enhancement.   05/22/2022 MRI BRAIN WO CONTRAST IMPRESSION: Acute infarction of the left frontal, insula, and anterior temporal pole. Additional punctate infarctions in the right frontal and parietal lobe.  Right ethmoid air cell mucocele, when compared to recent CT there is probable dehiscence of the lamina papyracea. Further evaluation with CT sinus can be considered for further characterization as clinically indicated.   05/22/2022 CTA HEAD W CONTRAST IMPRESSION: No acute intracranial hemorrhage or mass. There is an abrupt cut off of 1 of the anterior left M3 segment near the left frontal corona radiata (series 605 image 78) with associated punctate radiodensity on series 3 image 18  which may represent embolic calcified material. There is an adjacent hypodensity within the left frontal corona radiata which may be acute. The osseous structures are unremarkable.  No large vessel occlusion or stenosis. No aneurysm visualized.    PAIN:  Are you having pain? No  FALLS: Has patient fallen in last 6 months?  No  LIVING ENVIRONMENT: Lives with: living with a friend while he is getting better Lives in: House/apartment  PLOF:  Level of  assistance: Independent with ADLs, Independent with IADLs Employment: Retired   PATIENT GOALS    to improve expressive communication   SUBJECTIVE STATEMENT: "This is my last session?"  Pt accompanied by: friend who stayed in lobby  OBJECTIVE:  TODAY'S TREATMENT:  Skilled treatment session focused on pt's aphasia goals. SLP facilitated session by providing the following interventions:  Generative naming: Pt named x 10 items related to winter clothing, pt becomes perseverative on spelling previously written items  Pt's conversation is marked by semantic paraphasias and fillers such as "I can't think."    PATIENT EDUCATION: Education details: word finding deficits Person educated: Patient Education method: Explanation Education comprehension: verbalized understanding and needs further education  HOME EXERCISE PROGRAM:   N/A    GOALS:  Goals reviewed with patient? Yes  SHORT TERM GOALS: Target date: 10 sessions  With Min A, patient will complete a semantic feature analysis with at least 3 relevant features for 5/5 target words with minimal cues to improve word-finding skills.  Baseline: Goal status: INITIAL: progress made - 75%: progress made: not met d/t pt plateau   2.  With Min A, pt will list 10 items in semi-complex category.  Baseline:  Goal status: INITIAL: MET  3.  With Min A, patient will name abstract words and phrases from description at 90% accuracy.   Baseline:  Goal status: INITIAL: MET   LONG TERM GOALS: Target date: 05/04/2023  With Rare Min A, patient will generate sentences with 3 or more words in response to a situation at 80% accuracy in order to increase ability to communicate basic wants and needs.  Baseline:  Goal status: INITIAL: progress made: not met d/t pt plateau  2.  With Supervision A, patient will use a circumlocution strategy, writing, drawing, and/or gesturing to describe target words with 90% accuracy to improve word-finding and  reduce communication breakdowns, Baseline:  Goal status: INITIAL: progress made: not met d/t pt plateau  3.  With Supervision A, patient/family will demonstrate understanding of the following concepts: aphasia, spontaneous recovery, communication vs conversation, strengths/strategies to promote success, local resources by answering multiple choice questions with 80% accuracy when provided supported conversation in order to increase patient's participation in medical care.  Baseline:  Goal status: INITIAL: progress made: MET   ASSESSMENT:  CLINICAL IMPRESSION: Patient is a 82 y.o. 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    While pt has made some progress, further progress has been resistant to change. As a result, Pt continues to have moderate expressive aphasia with receptive language abilities as a strength. Suspect that pt's additional CVA on right frontal lobe made progress towards goals more difficult. Education completed with pt and his friend on residual deficits and continued required support. See the above treatment note for details.   OBJECTIVE IMPAIRMENTS include expressive language. These impairments are limiting patient from managing medications, managing appointments, managing finances, household responsibilities, ADLs/IADLs, and effectively communicating at home and in community. Factors affecting  potential to achieve goals and functional outcome are  N/A . Patient will benefit from skilled SLP services to address above impairments and improve overall function.  REHAB POTENTIAL: Good  PLAN: SLP FREQUENCY: 1-2x/week  SLP DURATION: 12 weeks  PLANNED INTERVENTIONS: Language facilitation, Functional tasks, Multimodal communication approach, SLP instruction and feedback, Compensatory strategies, and Patient/family education  Gelena Klosinski B. Dreama Saa, M.S., CCC-SLP, Tree surgeon Certified Brain Injury  Specialist [facility]  [facility] Services Office [phone] Ascom [phone] Fax [phone]

## 2023-05-09 ENCOUNTER — Ambulatory Visit: Payer: Medicare HMO | Admitting: Speech Pathology

## 2023-05-11 ENCOUNTER — Ambulatory Visit: Payer: Medicare HMO | Admitting: Speech Pathology

## 2023-05-16 ENCOUNTER — Ambulatory Visit: Payer: Medicare HMO | Admitting: Speech Pathology

## 2023-05-17 DIAGNOSIS — F09 Unspecified mental disorder due to known physiological condition: Secondary | ICD-10-CM | POA: Diagnosis not present

## 2023-05-17 DIAGNOSIS — R413 Other amnesia: Secondary | ICD-10-CM | POA: Diagnosis not present

## 2023-05-17 DIAGNOSIS — E119 Type 2 diabetes mellitus without complications: Secondary | ICD-10-CM | POA: Diagnosis not present

## 2023-05-17 DIAGNOSIS — Z8673 Personal history of transient ischemic attack (TIA), and cerebral infarction without residual deficits: Secondary | ICD-10-CM | POA: Diagnosis not present

## 2023-05-18 ENCOUNTER — Ambulatory Visit: Payer: Medicare HMO | Admitting: Speech Pathology

## 2023-05-23 ENCOUNTER — Ambulatory Visit: Payer: Medicare HMO | Admitting: Speech Pathology

## 2023-06-22 ENCOUNTER — Other Ambulatory Visit: Payer: Self-pay | Admitting: Family

## 2023-06-22 DIAGNOSIS — I10 Essential (primary) hypertension: Secondary | ICD-10-CM

## 2023-07-20 ENCOUNTER — Telehealth: Payer: Self-pay | Admitting: *Deleted

## 2023-07-20 NOTE — Telephone Encounter (Signed)
 Left VM requesting pt to call the office back so we can r/s his appt scheduled on 07/21/23

## 2023-07-21 ENCOUNTER — Ambulatory Visit: Payer: Medicare HMO | Admitting: Family

## 2023-07-26 ENCOUNTER — Ambulatory Visit (INDEPENDENT_AMBULATORY_CARE_PROVIDER_SITE_OTHER): Payer: Medicare HMO | Admitting: Family

## 2023-07-26 ENCOUNTER — Encounter: Payer: Self-pay | Admitting: Family

## 2023-07-26 VITALS — BP 126/62 | HR 63 | Temp 98.2°F | Ht 68.0 in | Wt 166.2 lb

## 2023-07-26 DIAGNOSIS — E538 Deficiency of other specified B group vitamins: Secondary | ICD-10-CM | POA: Diagnosis not present

## 2023-07-26 DIAGNOSIS — F09 Unspecified mental disorder due to known physiological condition: Secondary | ICD-10-CM

## 2023-07-26 DIAGNOSIS — Z79899 Other long term (current) drug therapy: Secondary | ICD-10-CM

## 2023-07-26 DIAGNOSIS — I10 Essential (primary) hypertension: Secondary | ICD-10-CM | POA: Diagnosis not present

## 2023-07-26 DIAGNOSIS — C61 Malignant neoplasm of prostate: Secondary | ICD-10-CM

## 2023-07-26 DIAGNOSIS — E119 Type 2 diabetes mellitus without complications: Secondary | ICD-10-CM | POA: Diagnosis not present

## 2023-07-26 DIAGNOSIS — I63512 Cerebral infarction due to unspecified occlusion or stenosis of left middle cerebral artery: Secondary | ICD-10-CM | POA: Diagnosis not present

## 2023-07-26 DIAGNOSIS — E782 Mixed hyperlipidemia: Secondary | ICD-10-CM | POA: Diagnosis not present

## 2023-07-26 LAB — COMPREHENSIVE METABOLIC PANEL
ALT: 27 U/L (ref 0–53)
AST: 24 U/L (ref 0–37)
Albumin: 4.1 g/dL (ref 3.5–5.2)
Alkaline Phosphatase: 40 U/L (ref 39–117)
BUN: 23 mg/dL (ref 6–23)
CO2: 29 meq/L (ref 19–32)
Calcium: 9.6 mg/dL (ref 8.4–10.5)
Chloride: 104 meq/L (ref 96–112)
Creatinine, Ser: 1.32 mg/dL (ref 0.40–1.50)
GFR: 50.46 mL/min — ABNORMAL LOW (ref >60.00)
Glucose, Bld: 83 mg/dL (ref 70–99)
Potassium: 4.5 meq/L (ref 3.5–5.1)
Sodium: 139 meq/L (ref 135–145)
Total Bilirubin: 0.6 mg/dL (ref 0.2–1.2)
Total Protein: 6.9 g/dL (ref 6.0–8.3)

## 2023-07-26 LAB — HEMOGLOBIN A1C: Hgb A1c MFr Bld: 5.9 % (ref 4.6–6.5)

## 2023-07-26 LAB — LIPID PANEL
Cholesterol: 116 mg/dL (ref 0–200)
HDL: 36.7 mg/dL — ABNORMAL LOW (ref >39.00)
LDL Cholesterol: 56 mg/dL (ref 0–99)
NonHDL: 79.71
Total CHOL/HDL Ratio: 3
Triglycerides: 117 mg/dL (ref 0.0–149.0)
VLDL: 23.4 mg/dL (ref 0.0–40.0)

## 2023-07-26 LAB — MICROALBUMIN / CREATININE URINE RATIO
Creatinine,U: 147 mg/dL
Microalb Creat Ratio: 5.4 mg/g (ref 0.0–30.0)
Microalb, Ur: 0.8 mg/dL (ref 0.0–1.9)

## 2023-07-26 LAB — VITAMIN B12: Vitamin B-12: 795 pg/mL (ref 211–911)

## 2023-07-26 NOTE — Assessment & Plan Note (Signed)
 Stable continue f/u with neurology as necessary  Continue aricept 5 mg once daily

## 2023-07-26 NOTE — Progress Notes (Signed)
 Established Patient Office Visit  Subjective:      CC:  Chief Complaint  Patient presents with   Medical Management of Chronic Issues    HPI: Lawrence Wong is a 83 y.o. male presenting on 07/26/2023 for Medical Management of Chronic Issues  Ischemic stroke, cognitive disorder with memory last, seeing neurology Dr. Malvin Johns, last seen 05/17/23, started on aricept 5 mg nightly. Ongoing use of daily asa 81 mg   Prostate cancer, followed by urology. Sees them every six months up to date.  On nubeqa 300 mg bid.  HLD, taking atorvastatin 10 mg nightly.   HTN: doing well on amlodipine 2.5 mg and losartan 25 mg once daily  Stable today in office 126/62  DM2, last eye exam over a year ago.     Social history:  Relevant past medical, surgical, family and social history reviewed and updated as indicated. Interim medical history since our last visit reviewed.  Allergies and medications reviewed and updated.  DATA REVIEWED: CHART IN EPIC     ROS: Negative unless specifically indicated above in HPI.    Current Outpatient Medications:    amLODipine (NORVASC) 2.5 MG tablet, Take 1 tablet by mouth daily., Disp: , Rfl:    atorvastatin (LIPITOR) 10 MG tablet, Take 1 tablet (10 mg total) by mouth daily., Disp: 90 tablet, Rfl: 3   Cholecalciferol (VITAMIN D3) 100000 UNIT/GM POWD, Take by mouth., Disp: , Rfl:    cyanocobalamin (VITAMIN B12) 1000 MCG tablet, Take by mouth., Disp: , Rfl:    darolutamide (NUBEQA) 300 MG tablet, Take 600 mg by mouth 2 (two) times daily with a meal., Disp: , Rfl:    losartan (COZAAR) 25 MG tablet, TAKE 1 TABLET (25 MG TOTAL) BY MOUTH DAILY., Disp: 90 tablet, Rfl: 1      Objective:    BP 126/62 (BP Location: Right Arm, Patient Position: Sitting, Cuff Size: Normal)   Pulse 63   Temp 98.2 F (36.8 C) (Temporal)   Ht 5\' 8"  (1.727 m)   Wt 166 lb 3.2 oz (75.4 kg)   SpO2 96%   BMI 25.27 kg/m   Wt Readings from Last 3 Encounters:  07/26/23 166  lb 3.2 oz (75.4 kg)  01/18/23 163 lb (73.9 kg)  01/13/23 161 lb (73 kg)    Physical Exam Constitutional:      General: He is not in acute distress.    Appearance: Normal appearance. He is normal weight. He is not ill-appearing, toxic-appearing or diaphoretic.  Cardiovascular:     Rate and Rhythm: Normal rate and regular rhythm.     Pulses: Normal pulses.  Pulmonary:     Effort: Pulmonary effort is normal.     Breath sounds: Normal breath sounds.  Musculoskeletal:        General: Normal range of motion.     Right lower leg: No edema.     Left lower leg: No edema.  Neurological:     General: No focal deficit present.     Mental Status: He is alert and oriented to person, place, and time. Mental status is at baseline.  Psychiatric:        Mood and Affect: Mood normal.        Behavior: Behavior normal.        Thought Content: Thought content normal.        Judgment: Judgment normal.     Title   Diabetic Foot Exam - detailed Is there a history of foot ulcer?: No Is  there a foot ulcer now?: No Is there swelling?: No Is there elevated skin temperature?: No Is there abnormal foot shape?: No Is there a claw toe deformity?: No Are the toenails long?: No Are the toenails thick?: No Are the toenails ingrown?: No Is the skin thin, fragile, shiny and hairless?": No Normal Range of Motion?: Yes Is there foot or ankle muscle weakness?: No Do you have pain in calf while walking?: No Are the shoes appropriate in style and fit?: Yes Can the patient see the bottom of their feet?: Yes Pulse Foot Exam completed.: Yes   Right Posterior Tibialis: Present Left posterior Tibialis: Present   Right Dorsalis Pedis: Present Left Dorsalis Pedis: Present     Sensory Foot Exam Completed.: Yes Semmes-Weinstein Monofilament Test "+" means "has sensation" and "-" means "no sensation"  R Foot Test Control: Pos L Foot Test Control: Pos   R Site 1-Great Toe: Pos L Site 1-Great Toe: Pos   R Site  4: Pos L Site 4: Pos   R site 5: Pos L Site 5: Pos  R Site 6: Pos L Site 6: Pos     Image components are not supported.   Image components are not supported. Image components are not supported.  [place] Comments           Assessment & Plan:  On statin therapy  Type 2 diabetes mellitus without complication, without long-term current use of insulin (HCC) Assessment & Plan: Repeat hga1c pending results.  Controlled Set pt up with referral for eye exam Foot exam completed in office Urine m/a ordered for today Cont losartan   Primary hypertension Assessment & Plan: Stable controlled Continue amlodipine 2.5 mg once daily as well as losartan 25 mg once daily    Cerebrovascular accident (CVA) due to occlusion of left middle cerebral artery (HCC) Assessment & Plan: Continue aspirin therapy 81 mg once daily    Recurrent prostate adenocarcinoma (HCC)  Mixed hyperlipidemia Assessment & Plan: Ordered lipid panel, pending results. Work on low cholesterol diet and exercise as tolerated Continue atorvastatin 10 mg nightly    Low serum vitamin B12  Mild cognitive disorder Assessment & Plan: Stable continue f/u with neurology as necessary  Continue aricept 5 mg once daily       Return in about 6 months (around 01/23/2024) for f/u CPE.  Mort Sawyers, MSN, APRN, FNP-C  [facility]

## 2023-07-26 NOTE — Assessment & Plan Note (Signed)
 Continue aspirin therapy 81 mg once daily

## 2023-07-26 NOTE — Assessment & Plan Note (Signed)
 Stable controlled Continue amlodipine 2.5 mg once daily as well as losartan 25 mg once daily

## 2023-07-26 NOTE — Assessment & Plan Note (Signed)
 Ordered lipid panel, pending results. Work on low cholesterol diet and exercise as tolerated Continue atorvastatin 10 mg nightly

## 2023-07-26 NOTE — Assessment & Plan Note (Signed)
 Repeat hga1c pending results.  Controlled Set pt up with referral for eye exam Foot exam completed in office Urine m/a ordered for today Cont losartan

## 2023-08-16 ENCOUNTER — Encounter: Payer: Self-pay | Admitting: *Deleted

## 2023-08-30 DIAGNOSIS — R413 Other amnesia: Secondary | ICD-10-CM | POA: Diagnosis not present

## 2023-08-30 DIAGNOSIS — Z8673 Personal history of transient ischemic attack (TIA), and cerebral infarction without residual deficits: Secondary | ICD-10-CM | POA: Diagnosis not present

## 2023-08-30 DIAGNOSIS — F03A0 Unspecified dementia, mild, without behavioral disturbance, psychotic disturbance, mood disturbance, and anxiety: Secondary | ICD-10-CM | POA: Diagnosis not present

## 2023-08-30 DIAGNOSIS — E1169 Type 2 diabetes mellitus with other specified complication: Secondary | ICD-10-CM | POA: Diagnosis not present

## 2023-10-04 DIAGNOSIS — I10 Essential (primary) hypertension: Secondary | ICD-10-CM | POA: Diagnosis not present

## 2023-10-04 DIAGNOSIS — I639 Cerebral infarction, unspecified: Secondary | ICD-10-CM | POA: Diagnosis not present

## 2023-10-04 DIAGNOSIS — I340 Nonrheumatic mitral (valve) insufficiency: Secondary | ICD-10-CM | POA: Diagnosis not present

## 2024-01-17 ENCOUNTER — Encounter

## 2024-01-23 ENCOUNTER — Encounter: Payer: Medicare HMO | Admitting: Family

## 2024-02-28 ENCOUNTER — Ambulatory Visit (INDEPENDENT_AMBULATORY_CARE_PROVIDER_SITE_OTHER): Admitting: Family

## 2024-02-28 VITALS — BP 124/60 | HR 67 | Temp 98.6°F | Ht 68.0 in | Wt 167.0 lb

## 2024-02-28 DIAGNOSIS — I10 Essential (primary) hypertension: Secondary | ICD-10-CM | POA: Diagnosis not present

## 2024-02-28 DIAGNOSIS — F03A0 Unspecified dementia, mild, without behavioral disturbance, psychotic disturbance, mood disturbance, and anxiety: Secondary | ICD-10-CM | POA: Diagnosis not present

## 2024-02-28 DIAGNOSIS — E119 Type 2 diabetes mellitus without complications: Secondary | ICD-10-CM | POA: Diagnosis not present

## 2024-02-28 DIAGNOSIS — Z23 Encounter for immunization: Secondary | ICD-10-CM

## 2024-02-28 DIAGNOSIS — E0849 Diabetes mellitus due to underlying condition with other diabetic neurological complication: Secondary | ICD-10-CM | POA: Insufficient documentation

## 2024-02-28 DIAGNOSIS — Z0000 Encounter for general adult medical examination without abnormal findings: Secondary | ICD-10-CM | POA: Diagnosis not present

## 2024-02-28 DIAGNOSIS — C61 Malignant neoplasm of prostate: Secondary | ICD-10-CM

## 2024-02-28 LAB — HEMOGLOBIN A1C: Hgb A1c MFr Bld: 6 % (ref 4.6–6.5)

## 2024-02-28 LAB — BASIC METABOLIC PANEL WITH GFR
BUN: 17 mg/dL (ref 6–23)
CO2: 29 meq/L (ref 19–32)
Calcium: 9.9 mg/dL (ref 8.4–10.5)
Chloride: 104 meq/L (ref 96–112)
Creatinine, Ser: 1.09 mg/dL (ref 0.40–1.50)
GFR: 63.23 mL/min (ref >60.00)
Glucose, Bld: 92 mg/dL (ref 70–99)
Potassium: 4.4 meq/L (ref 3.5–5.1)
Sodium: 139 meq/L (ref 135–145)

## 2024-02-28 NOTE — Progress Notes (Signed)
 Subjective:  Patient ID: Lawrence Wong, male    DOB: 16-Oct-1940  Age: 83 y.o. MRN: [id number]  Patient Care Team: Corwin Antu, FNP as PCP - General (Family Medicine) Grayce Buddle, RN as Oncology Nurse Navigator   CC:  Chief Complaint  Patient presents with   Annual Exam    HPI Lawrence Wong is a 83 y.o. male who presents today for an annual physical exam. He reports consuming a general diet. Regular walking around stays active He generally feels well. He reports sleeping well. He does have additional problems to discuss today.   Vision:Not within last year Dental:Receives regular dental care  Pt is with acute concerns.   Discussed the use of AI scribe software for clinical note transcription with the patient, who gave verbal consent to proceed.  History of Present Illness Lawrence Wong is an 83 year old male who presents for a follow-up visit and medication review.  He is currently taking losartan  25 mg once daily and amlodipine 5 mg once daily for blood pressure management. However, he has been taking losartan  four times a day, which is incorrect according to his prescribed regimen. He last saw his cardiologist in May 2025.  He has a history of mitral valve regurgitation, which is stable with an ejection fraction of 60-60%. He was previously treated for mitral valve endocarditis with six weeks of IV antibiotics in 2024.  He is also on atorvastatin  for cholesterol management, donepezil 10 mg at night for memory, and aspirin  81 mg daily. He takes vitamin D  and Nubeqa for prostate cancer, which is currently in remission.  In terms of social history, he is not a smoker and remains active, working outside daily. He is up to date with dental work but not with eye exams. He experiences occasional tingling in his feet but no calf pain when walking. He has hearing aids but does not always wear them. No regular constipation or diarrhea, and occasional hearing  difficulties.   Advanced Directives Patient does have advanced directives i. He does have a copy in the electronic medical record.   DEPRESSION SCREENING    07/26/2023   11:44 AM 01/18/2023   11:08 AM 01/13/2023   11:10 AM 10/18/2022    9:24 AM 07/19/2022   11:00 AM 01/12/2022    8:39 AM 01/12/2021   11:32 AM  PHQ 2/9 Scores  PHQ - 2 Score 1 0 0 6 3 0 0  PHQ- 9 Score 1   25 17        ROS: Negative unless specifically indicated above in HPI.    Current Outpatient Medications:    amLODipine (NORVASC) 5 MG tablet, Take 5 mg by mouth daily., Disp: , Rfl:    atorvastatin  (LIPITOR) 10 MG tablet, Take 1 tablet (10 mg total) by mouth daily., Disp: 90 tablet, Rfl: 3   Cholecalciferol (VITAMIN D3) 100000 UNIT/GM POWD, Take by mouth., Disp: , Rfl:    cyanocobalamin  (VITAMIN B12) 1000 MCG tablet, Take by mouth., Disp: , Rfl:    darolutamide (NUBEQA) 300 MG tablet, Take 600 mg by mouth 2 (two) times daily with a meal., Disp: , Rfl:    donepezil (ARICEPT) 10 MG tablet, Take 10 mg by mouth., Disp: , Rfl:    losartan  (COZAAR ) 25 MG tablet, TAKE 1 TABLET (25 MG TOTAL) BY MOUTH DAILY., Disp: 90 tablet, Rfl: 1    Objective:    BP 124/60   Pulse 67   Temp 98.6 F (37 C) (Oral)  Ht 5' 8 (1.727 m)   Wt 167 lb (75.8 kg)   SpO2 98%   BMI 25.39 kg/m   BP Readings from Last 3 Encounters:  02/28/24 124/60  07/26/23 126/62  01/18/23 138/78      Physical Exam Vitals reviewed.  Constitutional:      General: He is not in acute distress.    Appearance: Normal appearance. He is normal weight. He is not ill-appearing, toxic-appearing or diaphoretic.  HENT:     Head: Normocephalic.     Right Ear: Tympanic membrane normal.     Left Ear: Tympanic membrane normal.     Nose: Nose normal.     Mouth/Throat:     Mouth: Mucous membranes are moist.  Eyes:     Pupils: Pupils are equal, round, and reactive to light.  Cardiovascular:     Rate and Rhythm: Normal rate and regular rhythm.  Pulmonary:      Effort: Pulmonary effort is normal.     Breath sounds: Normal breath sounds.  Abdominal:     General: Abdomen is flat.     Tenderness: There is no abdominal tenderness.  Musculoskeletal:        General: Normal range of motion.  Skin:    General: Skin is warm.  Neurological:     General: No focal deficit present.     Mental Status: He is alert and oriented to person, place, and time. Mental status is at baseline.  Psychiatric:        Mood and Affect: Mood normal.        Behavior: Behavior normal.        Thought Content: Thought content normal.        Judgment: Judgment normal.     Title   Diabetic Foot Exam - detailed Visual Foot Exam completed.: Yes  Is there a history of foot ulcer?: No Is there a foot ulcer now?: No Is there swelling?: No Is there elevated skin temperature?: No Is there abnormal foot shape?: No Is there a claw toe deformity?: No Are the toenails long?: No Are the toenails thick?: No Are the toenails ingrown?: No Is the skin thin, fragile, shiny and hairless?: No Normal Range of Motion?: Yes Is there foot or ankle muscle weakness?: No Do you have pain in calf while walking?: No Are the shoes appropriate in style and fit?: Yes Can the patient see the bottom of their feet?: Yes Pulse Foot Exam completed.: Yes   Right Posterior Tibialis: Present Left posterior Tibialis: Present   Right Dorsalis Pedis: Present Left Dorsalis Pedis: Present     Sensory Foot Exam Completed.: Yes Semmes-Weinstein Monofilament Test + means has sensation and - means no sensation  R Foot Test Control: Pos L Foot Test Control: Pos   R Site 1-Great Toe: Pos L Site 1-Great Toe: Pos   R Site 4: Pos L Site 4: Pos   R site 5: Pos L Site 5: Pos  R Site 6: Pos L Site 6: Pos     Image components are not supported.   Image components are not supported. Image components are not supported.  [place] Comments    f  Results DIAGNOSTIC Transthoracic  echocardiogram (TTE): Stable severe mitral valve regurgitation, ejection fraction 60-65%      Assessment & Plan:   Assessment and Plan Assessment & Plan Adult Wellness Visit Routine wellness visit with emphasis on preventive care. - Administer flu shot - Recommend annual eye exam - Encourage regular physical activity  Patient Counseling(The following topics were reviewed):  Preventative care handout given to pt  Health maintenance and immunizations reviewed. Please refer to Health maintenance section. Pt advised on safe sex, wearing seatbelts in car, and proper nutrition labwork ordered today for annual Dental health: Discussed importance of regular tooth brushing, flossing, and dental visits.   Primary hypertension Blood pressure management with amlodipine and losartan . Dosage of losartan  clarified to 25 mg once daily and amlodipine to 5 mg once daily. Blood pressure remains mildly elevated. - Continue amlodipine 5 mg once daily - Continue losartan  25 mg once daily - Provide updated medication list to ensure correct dosing  Type 2 diabetes mellitus without complication Diabetes management is well-controlled with A1c in the prediabetic range. Emphasized regular monitoring of kidney function and A1c levels. - Order A1c test - Order kidney function test  Mixed hyperlipidemia Cholesterol levels within normal limits as of May. No need for repeat testing at this time. - Continue atorvastatin  as prescribed  Severe mitral valve regurgitation Severe mitral valve regurgitation with stable ejection fraction. Follow-up with cardiology planned for November with repeat ultrasound. - Continue current management - Follow-up with cardiology in November for repeat ultrasound  Prostate cancer in remission Prostate cancer in remission with no evidence of disease on recent blood tests. Continuing darolutamide as prescribed by urologist. - Continue darolutamide as prescribed  Hearing  loss Intermittent hearing difficulties. Has hearing aids but was not wearing them during the visit.           Follow-up: Return in about 1 year (around 02/27/2025) for f/u CPE.   Ginger Patrick, FNP

## 2024-02-29 ENCOUNTER — Ambulatory Visit: Payer: Self-pay | Admitting: Family

## 2024-03-08 NOTE — Progress Notes (Signed)
 Lawrence Wong                                          MRN: [id number]   03/08/2024   The [facility] Quality Team Specialist reviewed this patient medical record for the purposes of chart review for care gap closure. The following were reviewed: abstraction for care gap closure-controlling blood pressure.    [facility] Quality Team

## 2024-03-21 DIAGNOSIS — Z8673 Personal history of transient ischemic attack (TIA), and cerebral infarction without residual deficits: Secondary | ICD-10-CM | POA: Diagnosis not present

## 2024-03-21 DIAGNOSIS — Z1331 Encounter for screening for depression: Secondary | ICD-10-CM | POA: Diagnosis not present

## 2024-03-21 DIAGNOSIS — R4701 Aphasia: Secondary | ICD-10-CM | POA: Diagnosis not present

## 2024-03-21 DIAGNOSIS — F03A0 Unspecified dementia, mild, without behavioral disturbance, psychotic disturbance, mood disturbance, and anxiety: Secondary | ICD-10-CM | POA: Diagnosis not present

## 2024-03-22 ENCOUNTER — Ambulatory Visit (INDEPENDENT_AMBULATORY_CARE_PROVIDER_SITE_OTHER)

## 2024-03-22 VITALS — BP 110/78 | Ht 68.0 in | Wt 165.4 lb

## 2024-03-22 DIAGNOSIS — Z0000 Encounter for general adult medical examination without abnormal findings: Secondary | ICD-10-CM

## 2024-03-22 NOTE — Patient Instructions (Addendum)
 Lawrence Wong,  Thank you for taking the time for your Medicare Wellness Visit. I appreciate your continued commitment to your health goals. Please review the care plan we discussed, and feel free to reach out if I can assist you further.  Medicare recommends these wellness visits once per year to help you and your care team stay ahead of potential health issues. These visits are designed to focus on prevention, allowing your provider to concentrate on managing your acute and chronic conditions during your regular appointments.  Please note that Annual Wellness Visits do not include a physical exam. Some assessments may be limited, especially if the visit was conducted virtually. If needed, we may recommend a separate in-person follow-up with your provider.  Ongoing Care Seeing your primary care provider every 3 to 6 months helps us  monitor your health and provide consistent, personalized care.   Referrals If a referral was made during today's visit and you haven't received any updates within two weeks, please contact the referred provider directly to check on the status.   There are several Eye Doctors in your area. Here are a few that usually accept all insurance types:  [facility] [address] [place], [place] [address] Phone: [phone]  [facility] [address] [place], [place] [address] Phone: [phone]  [facility] [address] [place], [place] [address] Phone: [phone]  [facility] [address] [place], [place] [address] Phone: [phone]  [facility] [address] [place], [place] [address] Phone: [phone]  [facility] [address] [place], [place] [address] Phone: [phone]  Please let us  know if you require a referral for an eye exam appointment. Thank you!  Recommended Screenings:  Health Maintenance  Topic Date Due   Complete foot exam   Never done   Eye exam for diabetics  Never done   Medicare Annual Wellness  Visit  01/13/2024   COVID-19 Vaccine (6 - 2025-26 season) 01/30/2024   Yearly kidney health urinalysis for diabetes  07/25/2024   Hemoglobin A1C  08/27/2024   Yearly kidney function blood test for diabetes  02/27/2025   DTaP/Tdap/Td vaccine (3 - Td or Tdap) 03/24/2032   Pneumococcal Vaccine for age over 54  Completed   Flu Shot  Completed   Zoster (Shingles) Vaccine  Completed   Meningitis B Vaccine  Aged Out   Hepatitis C Screening  Discontinued       01/13/2023   11:10 AM  Advanced Directives  Does Patient Have a Medical Advance Directive? Yes  Type of Estate agent of [place];Living will  Copy of Healthcare Power of Attorney in Chart? No - copy requested   Advance Care Planning is important because it: Ensures you receive medical care that aligns with your values, goals, and preferences. Provides guidance to your family and loved ones, reducing the emotional burden of decision-making during critical moments.  Vision: Annual vision screenings are recommended for early detection of glaucoma, cataracts, and diabetic retinopathy. These exams can also reveal signs of chronic conditions such as diabetes and high blood pressure.  Dental: Annual dental screenings help detect early signs of oral cancer, gum disease, and other conditions linked to overall health, including heart disease and diabetes.

## 2024-03-22 NOTE — Progress Notes (Addendum)
 Subjective:   Rishabh Rinkenberger is a 83 y.o. who presents for a Medicare Wellness preventive visit.  As a reminder, Annual Wellness Visits don't include a physical exam, and some assessments may be limited, especially if this visit is performed virtually. We may recommend an in-person follow-up visit with your provider if needed.  Visit Complete: In person  Persons Participating in Visit: Patient.  AWV Questionnaire: No: Patient Medicare AWV questionnaire was not completed prior to this visit.      Objective:    Today's Vitals   03/22/24 1250  BP: 110/78  Weight: 165 lb 6.4 oz (75 kg)  Height: 5' 8 (1.727 m)   Body mass index is 25.15 kg/m.     03/22/2024    1:04 PM 01/13/2023   11:10 AM 01/12/2022    8:33 AM 06/13/2020    8:45 AM 01/02/2020    1:17 PM 01/05/2018   11:24 AM  Advanced Directives  Does Patient Have a Medical Advance Directive? Yes Yes No No No No   Type of Estate agent of [place];Living will Healthcare Power of [place];Living will      Does patient want to make changes to medical advance directive?    Yes (MAU/Ambulatory/Procedural Areas - Information given)    Copy of Healthcare Power of Attorney in Chart? Yes - validated most recent copy scanned in chart (See row information) No - copy requested      Would patient like information on creating a medical advance directive?   No - Patient declined Yes (MAU/Ambulatory/Procedural Areas - Information given) No - Patient declined Yes (MAU/Ambulatory/Procedural Areas - Information given)      Data saved with a previous flowsheet row definition    Current Medications (verified) Outpatient Encounter Medications as of 03/22/2024  Medication Sig   amLODipine (NORVASC) 5 MG tablet Take 5 mg by mouth daily.   atorvastatin  (LIPITOR) 10 MG tablet Take 1 tablet (10 mg total) by mouth daily.   Cholecalciferol (VITAMIN D3) 100000 UNIT/GM POWD Take by mouth.   cyanocobalamin  (VITAMIN B12) 1000  MCG tablet Take by mouth.   darolutamide (NUBEQA) 300 MG tablet Take 600 mg by mouth 2 (two) times daily with a meal.   donepezil (ARICEPT) 10 MG tablet Take 10 mg by mouth.   losartan  (COZAAR ) 25 MG tablet TAKE 1 TABLET (25 MG TOTAL) BY MOUTH DAILY.   No facility-administered encounter medications on file as of 03/22/2024.    Allergies (verified) Patient has no known allergies.   History: Past Medical History:  Diagnosis Date   Allergy    Arthritis    Asthma    Chicken pox    Chronic kidney disease    Colon polyps    Diabetes mellitus without complication (HCC)    GERD (gastroesophageal reflux disease)    Heart murmur    Hyperlipidemia    Hypertension    Prostate cancer (HCC)    Syphilis    Past Surgical History:  Procedure Laterality Date   INGUINAL LYMPH NODE BIOPSY     PROSTATE BIOPSY     Family History  Problem Relation Age of Onset   Heart disease Mother    Cancer Neg Hx    Breast cancer Neg Hx    Prostate cancer Neg Hx    Colon cancer Neg Hx    Pancreatic cancer Neg Hx    Social History   Socioeconomic History   Marital status: Single    Spouse name: Not on file  Number of children: 1   Years of education: Not on file   Highest education level: Not on file  Occupational History   Occupation: retired   Occupation: retired  Tobacco Use   Smoking status: Former    Current packs/day: 0.00    Average packs/day: 1 pack/day for 40.0 years (40.0 ttl pk-yrs)    Types: Cigarettes    Start date: 06/01/1943    Quit date: 06/01/1983    Years since quitting: 40.8   Smokeless tobacco: Never   Tobacco comments:    States, I have been around cigarettes my entire lift. 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      Family: one son - Madeleine - in [place] , does not see him often; step children in       Enjoys: works and keeps busy on the [employer] - mowing      Exercise: working and keeping busy   Diet: eats pretty good      Safety   Seat belts: yes   Guns: Yes  and secure   Safe in relationships: Yes    Social Drivers of Corporate investment banker Strain: Low Risk  (03/22/2024)   Overall Financial Resource Strain (CARDIA)    Difficulty of Paying Living Expenses: Not hard at all  Food Insecurity: No Food Insecurity (03/22/2024)   Hunger Vital Sign    Worried About Running Out of Food in the Last Year: Never true    Ran Out of Food in the Last Year: Never true  Transportation Needs: No Transportation Needs (03/22/2024)   PRAPARE - Administrator, Civil Service (Medical): No    Lack of Transportation (Non-Medical): No  Physical Activity: Insufficiently Active (03/22/2024)   Exercise Vital Sign    Days of Exercise per Week: 5 days    Minutes of Exercise per Session: 20 min  Stress: No Stress Concern Present (03/22/2024)   [employer] of Occupational Health - Occupational Stress Questionnaire    Feeling of Stress: Only a little  Social Connections: Moderately Isolated (03/22/2024)   Social Connection and Isolation Panel    Frequency of Communication with Friends and Family: More than three times a week    Frequency of Social Gatherings with Friends and Family: More than three times a week    Attends Religious Services: More than 4 times per year    Active Member of [employer] or Organizations: No    Attends Banker Meetings: Never    Marital Status: Never married    Tobacco Counseling Counseling given: Not Answered Tobacco comments: States, I have been around  cigarettes my entire lift. 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    Clinical Intake:  Pre-visit preparation completed: Yes  Pain : No/denies pain     BMI - recorded: 25.15 Nutritional Risks: None Diabetes: Yes (pt says no) CBG done?: No Did pt. bring in CBG monitor from home?:  No  Lab Results  Component Value Date   HGBA1C 6.0 02/28/2024   HGBA1C 5.9 07/26/2023   HGBA1C 5.9 10/18/2022     How often do you need to have someone help you when you read instructions, pamphlets, or other written materials from your doctor or pharmacy?: 1 - Never  Interpreter Needed?: No  Comments: lives alone Information entered by :: B.Ralonda Tartt,LPN   Activities of Daily Living     03/22/2024    1:04 PM  In your present state of health, do you have any difficulty performing the following activities:  Hearing? 1  Vision? 0  Difficulty concentrating or making decisions? 0  Walking or climbing stairs? 0  Dressing or bathing? 0  Doing errands, shopping? 1  Preparing Food and eating ? N  Using the Toilet? N  In the past six months, have you accidently leaked urine? N  Do you have problems with loss of bowel control? N  Managing your Medications? N  Managing your Finances? N  Housekeeping or managing your Housekeeping? N    Patient Care Team: Corwin Antu, FNP as PCP - General (Family Medicine) Grayce Buddle, RN as Oncology Nurse Navigator  I have updated your Care Teams any recent Medical Services you may have received from other providers in the past year.     Assessment:   This is a routine wellness examination for Jahmil.  Hearing/Vision screen Hearing Screening - Comments:: Pt hearing not good: has hearing aids but does not wear Pt has appt at [facility]  Vision Screening - Comments:: Pt says their vision is good without glasses No eye Dr appt-will make appt soon   Goals Addressed             This Visit's Progress    Patient Stated   On track     03/22/24- I will maintain and continue medications as prescribed.        Depression Screen     03/22/2024    1:01 PM 07/26/2023   11:44 AM 01/18/2023   11:08 AM 01/13/2023   11:10 AM 10/18/2022    9:24 AM 07/19/2022   11:00 AM 01/12/2022    8:39 AM  PHQ 2/9 Scores  PHQ - 2 Score 0 1 0 0 6 3 0  PHQ- 9 Score  1   25 17      Fall Risk     03/22/2024   12:59 PM 07/26/2023   11:43 AM 01/18/2023   11:07 AM 01/13/2023   11:13 AM 10/18/2022    9:23 AM  Fall Risk   Falls in the past year? 0 1 0 0 0  Number falls in past yr: 0 0 0 0 0  Injury with Fall? 0 0 0 0 0  Risk for fall due to : No Fall Risks History of fall(s) No Fall Risks Impaired vision No Fall Risks  Follow up Falls prevention discussed;Education provided Falls evaluation completed Falls evaluation completed Falls prevention discussed Falls evaluation completed    MEDICARE RISK AT HOME:  Medicare Risk at Home Any stairs in or around the home?: Yes If so, are there any without handrails?: No Home free of loose throw rugs in walkways, pet beds, electrical cords, etc?: Yes Adequate lighting in your home to reduce risk of falls?: Yes Life alert?: No Use of a cane, walker or w/c?: No Grab bars in the bathroom?: Yes Shower chair or bench in shower?: No Elevated toilet seat or a handicapped toilet?: Yes  TIMED UP  AND GO:  Was the test performed?  Yes  Length of time to ambulate 10 feet: 14 sec Gait slow and steady without use of assistive device  Cognitive Function: 6CIT completed    01/02/2020    1:21 PM  MMSE - Mini Mental State Exam  Orientation to time 5  Orientation to Place 5  Registration 3  Attention/ Calculation 5  Recall 3  Language- repeat 1        03/22/2024    1:06 PM 01/13/2023   11:14 AM 01/12/2022    8:37 AM  6CIT Screen  What Year? 0 points 0 points 0 points  What month? 0 points 0 points 0 points  What time? 0 points 0 points 0 points  Count back from 20 0 points 0 points 0 points  Months in  reverse 4 points 0 points 0 points  Repeat phrase 6 points 0 points 0 points  Total Score 10 points 0 points 0 points    Immunizations Immunization History  Administered Date(s) Administered    sv, Bivalent, Protein Subunit Rsvpref,pf (Abrysvo) 02/14/2023   Fluad Quad(high Dose 65+) 02/02/2022   Fluad Trivalent(High Dose 65+) 02/22/2023   INFLUENZA, HIGH DOSE SEASONAL PF 03/29/2018, 02/28/2024   Influenza,inj,Quad PF,6+ Mos 02/02/2022   Influenza-Unspecified 03/01/2019   Moderna Covid-19 Fall Seasonal Vaccine 70yrs & older 05/27/2023   PFIZER(Purple Top)SARS-COV-2 Vaccination 07/27/2019, 08/22/2019, 04/30/2020, 10/09/2020   Pneumococcal Conjugate-13 01/08/2016   Pneumococcal Polysaccharide-23 12/27/2011, 02/10/2022   Tdap 12/27/2011, 03/24/2022   Zoster Recombinant(Shingrix) 03/28/2017, 02/07/2020, 03/24/2022    Screening Tests Health Maintenance  Topic Date Due   FOOT EXAM  Never done   OPHTHALMOLOGY EXAM  Never done   COVID-19 Vaccine (6 - 2025-26 season) 01/30/2024   Diabetic kidney evaluation - Urine ACR  07/25/2024   HEMOGLOBIN A1C  08/27/2024   Diabetic kidney evaluation - eGFR measurement  02/27/2025   Medicare Annual Wellness (AWV)  03/22/2025   DTaP/Tdap/Td (3 - Td or Tdap) 03/24/2032   Pneumococcal Vaccine: 50+ Years  Completed   Influenza Vaccine  Completed   Zoster Vaccines- Shingrix  Completed   Meningococcal B Vaccine  Aged Out   Hepatitis C Screening  Discontinued    Health Maintenance Items Addressed: None at this time Eye appt to be scheduled:lised in AVS  Additional Screening:  Vision Screening: Recommended annual ophthalmology exams for early detection of glaucoma and other disorders of the eye. Is the patient up to date with their annual eye exam?  No  Who is the provider or what is the name of the office in which the patient attends annual eye exams? None at this time:list provided  Dental Screening: Recommended annual dental exams for proper  oral hygiene  Community Resource Referral / Chronic Care Management: CRR required this visit?  No   CCM required this visit?  No   Plan:    I have personally reviewed and noted the following in the patient's chart:   Medical and social history Use of alcohol, tobacco or illicit drugs  Current medications and supplements including opioid prescriptions. Patient is not currently taking opioid prescriptions. Functional ability and status Nutritional status Physical activity Advanced directives List of other physicians Hospitalizations, surgeries, and ER visits in previous 12 months Vitals Screenings to include cognitive, depression, and falls Referrals and appointments  In addition, I have reviewed and discussed with patient certain preventive protocols, quality metrics, and best practice recommendations. A written personalized care plan for preventive services as well as general preventive  health recommendations were provided to patient.   Erminio LITTIE Saris, LPN   [id number]   After Visit Summary: (In Person-Printed) AVS printed and given to the patient  Notes: Nothing significant to report at this time.

## 2024-04-12 DIAGNOSIS — C61 Malignant neoplasm of prostate: Secondary | ICD-10-CM | POA: Diagnosis not present

## 2024-04-17 DIAGNOSIS — I10 Essential (primary) hypertension: Secondary | ICD-10-CM | POA: Diagnosis not present

## 2024-04-17 DIAGNOSIS — I340 Nonrheumatic mitral (valve) insufficiency: Secondary | ICD-10-CM | POA: Diagnosis not present

## 2025-02-28 ENCOUNTER — Encounter: Admitting: Family

## 2025-03-26 ENCOUNTER — Ambulatory Visit
